# Patient Record
Sex: Male | Born: 1955 | Race: Black or African American | Hispanic: No | Marital: Married | Smoking: Current every day smoker
Health system: Southern US, Community
[De-identification: ages and names within clinical notes are randomized; demographics above are authoritative.]

## PROBLEM LIST (undated history)

## (undated) DIAGNOSIS — K469 Unspecified abdominal hernia without obstruction or gangrene: Secondary | ICD-10-CM

## (undated) DIAGNOSIS — E119 Type 2 diabetes mellitus without complications: Secondary | ICD-10-CM

## (undated) HISTORY — PX: CYSTECTOMY: SUR359

## (undated) HISTORY — PX: HEMORRHOID SURGERY: SHX153

---

## 2008-09-19 ENCOUNTER — Emergency Department (HOSPITAL_COMMUNITY): Admission: EM | Admit: 2008-09-19 | Discharge: 2008-09-19 | Payer: Self-pay | Admitting: Emergency Medicine

## 2008-09-28 ENCOUNTER — Emergency Department (HOSPITAL_COMMUNITY): Admission: EM | Admit: 2008-09-28 | Discharge: 2008-09-28 | Payer: Self-pay | Admitting: Family Medicine

## 2010-08-23 LAB — BASIC METABOLIC PANEL WITH GFR
BUN: 6 mg/dL (ref 6–23)
CO2: 27 meq/L (ref 19–32)
Calcium: 8.6 mg/dL (ref 8.4–10.5)
Chloride: 106 meq/L (ref 96–112)
Creatinine, Ser: 1.2 mg/dL (ref 0.4–1.5)
GFR calc non Af Amer: >60 mL/min
Glucose, Bld: 112 mg/dL — ABNORMAL HIGH (ref 70–99)
Potassium: 3.7 meq/L (ref 3.5–5.1)
Sodium: 138 meq/L (ref 135–145)

## 2010-08-23 LAB — DIFFERENTIAL
Basophils Absolute: 0 K/uL (ref 0.0–0.1)
Basophils Relative: 1 % (ref 0–1)
Eosinophils Absolute: 0.1 K/uL (ref 0.0–0.7)
Eosinophils Relative: 2 % (ref 0–5)
Lymphocytes Relative: 59 % — ABNORMAL HIGH (ref 12–46)
Lymphs Abs: 3.6 K/uL (ref 0.7–4.0)
Monocytes Absolute: 0.7 K/uL (ref 0.1–1.0)
Monocytes Relative: 12 % (ref 3–12)
Neutro Abs: 1.6 K/uL — ABNORMAL LOW (ref 1.7–7.7)
Neutrophils Relative %: 27 % — ABNORMAL LOW (ref 43–77)

## 2010-08-23 LAB — CBC
HCT: 40.1 % (ref 39.0–52.0)
Hemoglobin: 13.8 g/dL (ref 13.0–17.0)
MCHC: 34.3 g/dL (ref 30.0–36.0)
Platelets: 209 10*3/uL (ref 150–400)
RDW: 13.4 % (ref 11.5–15.5)

## 2010-08-23 LAB — POCT CARDIAC MARKERS
CKMB, poc: 2.7 ng/mL (ref 1.0–8.0)
Myoglobin, poc: 197 ng/mL (ref 12–200)
Troponin i, poc: <0.05 ng/mL (ref 0.00–0.09)

## 2010-08-23 LAB — D-DIMER, QUANTITATIVE: D-Dimer, Quant: 0.26 ug/mL-FEU (ref 0.00–0.48)

## 2011-05-07 ENCOUNTER — Encounter: Payer: Self-pay | Admitting: *Deleted

## 2011-05-07 ENCOUNTER — Emergency Department (HOSPITAL_COMMUNITY)
Admission: EM | Admit: 2011-05-07 | Discharge: 2011-05-07 | Disposition: A | Discharge status: Home / Self Care | Payer: 59 | Attending: Emergency Medicine | Admitting: Emergency Medicine

## 2011-05-07 ENCOUNTER — Encounter (HOSPITAL_COMMUNITY): Payer: Self-pay | Admitting: Emergency Medicine

## 2011-05-07 ENCOUNTER — Emergency Department (HOSPITAL_COMMUNITY)
Admission: EM | Admit: 2011-05-07 | Discharge: 2011-05-07 | Disposition: A | Discharge status: Home / Self Care | Payer: 59 | Source: Home / Self Care | Attending: Emergency Medicine | Admitting: Emergency Medicine

## 2011-05-07 ENCOUNTER — Emergency Department (HOSPITAL_COMMUNITY): Payer: 59

## 2011-05-07 DIAGNOSIS — K089 Disorder of teeth and supporting structures, unspecified: Secondary | ICD-10-CM | POA: Insufficient documentation

## 2011-05-07 DIAGNOSIS — J390 Retropharyngeal and parapharyngeal abscess: Secondary | ICD-10-CM

## 2011-05-07 DIAGNOSIS — M542 Cervicalgia: Secondary | ICD-10-CM | POA: Insufficient documentation

## 2011-05-07 DIAGNOSIS — R51 Headache: Secondary | ICD-10-CM | POA: Insufficient documentation

## 2011-05-07 DIAGNOSIS — R1310 Dysphagia, unspecified: Secondary | ICD-10-CM | POA: Insufficient documentation

## 2011-05-07 DIAGNOSIS — F17200 Nicotine dependence, unspecified, uncomplicated: Secondary | ICD-10-CM | POA: Insufficient documentation

## 2011-05-07 HISTORY — DX: Unspecified abdominal hernia without obstruction or gangrene: K46.9

## 2011-05-07 MED ORDER — LIDOCAINE VISCOUS 2 % MT SOLN
20.0000 mL | Freq: Once | OROMUCOSAL | Status: AC
  Administered 2011-05-07: 20 mL via OROMUCOSAL
  Filled 2011-05-07: qty 15

## 2011-05-07 MED ORDER — PANTOPRAZOLE SODIUM 40 MG PO TBEC: 40.0000 mg | DELAYED_RELEASE_TABLET | Freq: Every day | ORAL | Status: DC

## 2011-05-07 MED ORDER — IOHEXOL 300 MG/ML  SOLN
75.0000 mL | Freq: Once | INTRAMUSCULAR | Status: AC | PRN
  Administered 2011-05-07: 75 mL via INTRAVENOUS

## 2011-05-07 MED ORDER — FENTANYL CITRATE 0.05 MG/ML IJ SOLN
50.0000 ug | Freq: Once | INTRAMUSCULAR | Status: AC
  Administered 2011-05-07: 50 ug via INTRAVENOUS
  Filled 2011-05-07: qty 2

## 2011-05-07 MED ORDER — HYDROCODONE-ACETAMINOPHEN 10-300 MG/15ML PO SOLN: 10.0000 mL | Freq: Four times a day (QID) | ORAL | Status: DC | PRN

## 2011-05-07 NOTE — ED Provider Notes (Signed)
History     CSN: [id number]  Arrival date & time 05/07/11  1353   First MD Initiated Contact with Patient 05/07/11 1357      Chief Complaint  Patient presents with  . Oral Swelling    (Consider location/radiation/quality/duration/timing/severity/associated sxs/prior treatment) HPI Comments: Mr. Thain has had a five-day history of a toothache in the upper and lower dentition on the right. He saw his dentist 4 days ago at which time he was having some pain in his throat and some headache. The dentist did some x-rays and prescribed clindamycin and some pain pills, telling him that he had infections in his upper and lower molars on the right. Since then he's felt no better and today he's felt like her throat is closing up. He can't you drink anything. He has pain with swallowing and can't swallow his own saliva. He denies any difficulty with breathing. He's had no fever or chills, no swelling of the neck.   Past Medical History  Diagnosis Date  . Hernia   . Asthma     Past Surgical History  Procedure Date  . Cystectomy   . Hemorrhoid surgery     History reviewed. No pertinent family history.  History  Substance Use Topics  . Smoking status: Current Everyday Smoker -- 1.0 packs/day  . Smokeless tobacco: Not on file  . Alcohol Use: No      Review of Systems  Constitutional: Negative for fever and chills.  HENT: Positive for dental problem. Negative for sore throat, facial swelling, trouble swallowing, neck pain and voice change.   Respiratory: Negative for chest tightness and shortness of breath.     Allergies  Aspirin and Penicillins  Home Medications   Current Outpatient Rx  Name Route Sig Dispense Refill  . ACETAMINOPHEN-CODEINE PO Oral Take by mouth.      Marland Kitchen CLINDAMYCIN HCL 150 MG PO CAPS Oral Take 150 mg by mouth 3 (three) times daily.      . IBUPROFEN 800 MG PO TABS Oral Take 800 mg by mouth every 8 (eight) hours as needed.        BP 142/89  Pulse 86   Temp(Src) 98.3 F (36.8 C) (Oral)  Resp 19  SpO2 95%  Physical Exam  Nursing note and vitals reviewed. Constitutional: He appears well-developed and well-nourished. No distress.  HENT:  Head: Normocephalic and atraumatic. No trismus in the jaw.  Right Ear: External ear normal.  Left Ear: External ear normal.  Nose: Nose normal.  Mouth/Throat: Uvula is midline and oropharynx is clear and moist. Mucous membranes are not pale and not dry. No oral lesions. Abnormal dentition. Dental abscesses and dental caries present. No uvula swelling. No oropharyngeal exudate, posterior oropharyngeal edema or posterior oropharyngeal erythema.       Exam of the mouth reveals the pharynx to be clear with no obvious swelling and. There is no swelling of the floor the mouth and no pain to palpation of the floor the mouth. He has multiple carious teeth. The last 2 teeth on the right side upper and lower are tender to palpation. There is no swelling of the gingiva.  Eyes: Conjunctivae and EOM are normal. Pupils are equal, round, and reactive to light.  Neck: Normal range of motion. Neck supple.  Cardiovascular: Normal rate, regular rhythm and normal heart sounds.   Pulmonary/Chest: Effort normal and breath sounds normal. No respiratory distress.  Lymphadenopathy:    He has no cervical adenopathy.  Skin: He is not diaphoretic.  ED Course  Procedures (including critical care time)  Labs Reviewed - No data to display No results found.   1. Retropharyngeal abscess       MDM  I'm concerned that he has a retropharyngeal abscess and am sending him over to the hospital for further evaluation.        Roque Lias, MD 05/07/11 [phone]

## 2011-05-07 NOTE — ED Provider Notes (Signed)
History     CSN: [id number]  Arrival date & time 05/07/11  1645   First MD Initiated Contact with Patient 05/07/11 1657     5:44 PM HPI Patient reports 5 days ago followed up with his dentist for upper and lower right-sided tooth aches. States his dentist took an x-ray and prescribed clindamycin due to dental abscess his in both upper and lower teeth. Reports dental pain has resolved. Still has a little residual right-sided headache. However today is having difficulty swallowing. States unable to swallow at all due to severe pain. Points to pain located deep in his neck and not in the oropharynx. Denies fever, change in voice, or difficulty breathing Patient is a 55 y.o. male presenting with pharyngitis. The history is provided by the patient.  Sore Throat This is a new problem. The current episode started yesterday. The problem occurs constantly. The problem has been gradually worsening. Associated symptoms include headaches, neck pain and a sore throat. Pertinent negatives include no congestion, nausea or vomiting.    Past Medical History  Diagnosis Date  . Hernia   . Asthma     Past Surgical History  Procedure Date  . Cystectomy   . Hemorrhoid surgery     No family history on file.  History  Substance Use Topics  . Smoking status: Current Everyday Smoker -- 1.0 packs/day  . Smokeless tobacco: Not on file  . Alcohol Use: No      Review of Systems  HENT: Positive for sore throat, trouble swallowing, neck pain and dental problem. Negative for ear pain, congestion, rhinorrhea and neck stiffness.   Respiratory: Negative for shortness of breath.   Gastrointestinal: Negative for nausea and vomiting.  Neurological: Positive for headaches.  All other systems reviewed and are negative.    Allergies  Aspirin and Penicillins  Home Medications   Current Outpatient Rx  Name Route Sig Dispense Refill  . ACETAMINOPHEN-CODEINE #3 300-30 MG PO TABS Oral Take 1 tablet by mouth  every 6 (six) hours.      Marland Kitchen CLINDAMYCIN HCL 150 MG PO CAPS Oral Take 150 mg by mouth every 8 (eight) hours. For one week. Started on 12/20     . IBUPROFEN 800 MG PO TABS Oral Take 800 mg by mouth every 8 (eight) hours as needed. For pain.      BP 145/82  Pulse 88  Temp(Src) 98.2 F (36.8 C) (Oral)  Resp 22  SpO2 97%  Physical Exam  Constitutional: He is oriented to person, place, and time. He appears well-developed and well-nourished.  HENT:  Head: Normocephalic and atraumatic.  Right Ear: Tympanic membrane, external ear and ear canal normal.  Left Ear: Tympanic membrane, external ear and ear canal normal.  Nose: Nose normal.  Mouth/Throat: Uvula is midline, oropharynx is clear and moist and mucous membranes are normal.       Pain with swallowing. No difficulty with speaking.  Eyes: Pupils are equal, round, and reactive to light.  Lymphadenopathy:       Head (right side): Tonsillar adenopathy present. No submental, no submandibular and no preauricular adenopathy present.       Head (left side): No submental, no submandibular, no tonsillar, no preauricular, no posterior auricular and no occipital adenopathy present.  Neurological: He is alert and oriented to person, place, and time.  Skin: Skin is warm and dry. No rash noted. No erythema. No pallor.  Psychiatric: He has a normal mood and affect. His behavior is normal.  ED Course  Procedures   Ct Soft Tissue Neck W Contrast  05/07/2011  *RADIOLOGY REPORT*  Clinical Data: Difficulty swallowing and sore throat.  CT NECK WITH CONTRAST  Technique:  Multidetector CT imaging of the neck was performed with intravenous contrast.  Contrast: 75mL OMNIPAQUE IOHEXOL 300 MG/ML IV SOLN  Comparison: None.  Findings: The patient is missing multiple teeth.  There is periapical lucency about an upper right molar, tooth number two. There appears to be some destructive change along the periphery of the hard palate in this location.  No definite soft  tissue abscess is identified.  There is a large cavity in the lower right molar, tooth number 27.  The aerodigestive tract is unremarkable.  No mass is identified. Visualized intracranial contents appear normal.  Salivary glands have a normal CT appearance.  No lymphadenopathy is seen.  Lung apices are clear.  No focal bony abnormality with cervical spondylosis noted.  IMPRESSION:  1.  Periapical lucency about an upper right molar, tooth number two, compatible with peri apical abscess. There appears to be some bony destructive change about the tube but no soft tissue abscess is identified. 2.  Large cavity in a lower right molar, tooth number 27.  Original Report Authenticated By: Bernadene Bell. D'ALESSIO, M.D.     MDM  8:16 PM Discussed CT results with patient no acute abnormal findings. Recommend followup with your menstrual period and provide patient with referral and a PPI. Recommend return to the emergency department for persistent nausea vomiting especially if he is completely unable to swallow food. Patient states he is able to swallow if he "forces his food down". The patient agrees with plan and is ready for d/c    Thomasene Lot, [place] 05/07/11 2018

## 2011-05-07 NOTE — ED Notes (Signed)
Had right sore throat and HA - went to dentist on Thurs - had infection in 3 teeth.  Started clindamycin, Tyl #3, and IBU.  Started feeling throat swelling Fri or Sat.  States unable to eat - "won't go down easily".  Able to take drinks of water with difficulty.  Felt like yesterday his meds "got stuck".  Denies difficulty breathing, just swallowing.  Has tried gargling, coughing, drinking PO fluids without relief.  "It's like something stuck and I can't get it to go down".

## 2011-05-07 NOTE — ED Notes (Signed)
X 2 days of difficulty swallowing; saliva just sits there. Takes a while to swallow meds.

## 2011-05-07 NOTE — ED Provider Notes (Signed)
Medical screening examination/treatment/procedure(s) were performed by non-physician practitioner and as supervising physician I was immediately available for consultation/collaboration.   Sargun Rummell M Leonides Minder, MD 05/07/11 2346 

## 2011-05-07 NOTE — ED Notes (Signed)
Pt returned from CT °

## 2011-05-07 NOTE — ED Notes (Signed)
Pt transferred from [facility] to R/O retropharyngeal abscess.  Pt c/o sore throat and difficulty swallowing.  Pt had a 4 day history of toothache and was taking antibiotic per his dentist.

## 2011-05-22 ENCOUNTER — Other Ambulatory Visit (HOSPITAL_COMMUNITY): Payer: Self-pay | Admitting: Otolaryngology

## 2011-05-26 ENCOUNTER — Other Ambulatory Visit (HOSPITAL_COMMUNITY): Payer: 59

## 2011-05-26 ENCOUNTER — Ambulatory Visit (HOSPITAL_COMMUNITY)
Admission: RE | Admit: 2011-05-26 | Discharge: 2011-05-26 | Disposition: A | Discharge status: Home / Self Care | Payer: 59 | Source: Ambulatory Visit | Attending: Otolaryngology | Admitting: Otolaryngology

## 2011-05-26 DIAGNOSIS — K449 Diaphragmatic hernia without obstruction or gangrene: Secondary | ICD-10-CM | POA: Insufficient documentation

## 2011-05-26 DIAGNOSIS — R1310 Dysphagia, unspecified: Secondary | ICD-10-CM | POA: Insufficient documentation

## 2011-05-29 ENCOUNTER — Encounter: Payer: Self-pay | Admitting: Gastroenterology

## 2011-06-06 ENCOUNTER — Ambulatory Visit: Payer: 59 | Admitting: Gastroenterology

## 2011-07-21 ENCOUNTER — Emergency Department (HOSPITAL_COMMUNITY)
Admission: EM | Admit: 2011-07-21 | Discharge: 2011-07-21 | Disposition: A | Discharge status: Home / Self Care | Payer: 59 | Attending: Emergency Medicine | Admitting: Emergency Medicine

## 2011-07-21 ENCOUNTER — Emergency Department (HOSPITAL_COMMUNITY): Payer: 59

## 2011-07-21 ENCOUNTER — Encounter (HOSPITAL_COMMUNITY): Payer: Self-pay

## 2011-07-21 DIAGNOSIS — G510 Bell's palsy: Secondary | ICD-10-CM | POA: Insufficient documentation

## 2011-07-21 DIAGNOSIS — R29810 Facial weakness: Secondary | ICD-10-CM | POA: Insufficient documentation

## 2011-07-21 DIAGNOSIS — R209 Unspecified disturbances of skin sensation: Secondary | ICD-10-CM | POA: Insufficient documentation

## 2011-07-21 LAB — BASIC METABOLIC PANEL
BUN: 11 mg/dL (ref 6–23)
Creatinine, Ser: 1.21 mg/dL (ref 0.50–1.35)
GFR calc Af Amer: 76 mL/min — ABNORMAL LOW (ref >90)
GFR calc non Af Amer: 66 mL/min — ABNORMAL LOW (ref >90)
Glucose, Bld: 97 mg/dL (ref 70–99)
Potassium: 4 mEq/L (ref 3.5–5.1)

## 2011-07-21 LAB — DIFFERENTIAL
Basophils Relative: 0 % (ref 0–1)
Eosinophils Absolute: 0.1 10*3/uL (ref 0.0–0.7)
Eosinophils Relative: 1 % (ref 0–5)
Lymphs Abs: 4.1 10*3/uL — ABNORMAL HIGH (ref 0.7–4.0)
Monocytes Absolute: 0.5 10*3/uL (ref 0.1–1.0)
Monocytes Relative: 8 % (ref 3–12)

## 2011-07-21 LAB — CBC
HCT: 45.3 % (ref 39.0–52.0)
Hemoglobin: 15.3 g/dL (ref 13.0–17.0)
MCH: 27.2 pg (ref 26.0–34.0)
MCHC: 33.8 g/dL (ref 30.0–36.0)
MCV: 80.6 fL (ref 78.0–100.0)
RBC: 5.62 MIL/uL (ref 4.22–5.81)

## 2011-07-21 LAB — GLUCOSE, CAPILLARY

## 2011-07-21 MED ORDER — REFRESH P.M. OP OINT: TOPICAL_OINTMENT | Freq: Every day | OPHTHALMIC | Status: DC

## 2011-07-21 MED ORDER — VALACYCLOVIR HCL 1 G PO TABS: 1000.0000 mg | ORAL_TABLET | Freq: Three times a day (TID) | ORAL | Status: AC

## 2011-07-21 MED ORDER — PREDNISONE 20 MG PO TABS: 60.0000 mg | ORAL_TABLET | Freq: Every day | ORAL | Status: DC

## 2011-07-21 MED ORDER — TEARS RENEWED OP SOLN: 1.0000 [drp] | OPHTHALMIC | Status: DC

## 2011-07-21 NOTE — ED Notes (Signed)
Patient transported to MRI 

## 2011-07-21 NOTE — Discharge Instructions (Signed)
Bell's Palsy  Bell's palsy is a condition in which the muscles on one side of the face cannot move (paralysis). This is because the nerves in the face are paralyzed. It is most often thought to be caused by a virus. The virus causes swelling of the nerve that controls movement on one side of the face. The nerve travels through a tight space surrounded by bone. When the nerve swells, it can be compressed by the bone. This results in damage to the protective covering around the nerve. This damage interferes with how the nerve communicates with the muscles of the face. As a result, it can cause weakness or paralysis of the facial muscles.  Injury (trauma), tumor, and surgery may cause Bell's palsy, but most of the time the cause is unknown. It is a relatively common condition. It starts suddenly (abrupt onset) with the paralysis usually ending within 2 days. Bell's palsy is not dangerous. But because the eye does not close properly, you may need care to keep the eye from getting dry. This can include splinting (to keep the eye shut) or moistening with artificial tears. Bell's palsy very seldom occurs on both sides of the face at the same time. SYMPTOMS   Eyebrow sagging.   Drooping of the eyelid and corner of the mouth.   Inability to close one eye.   Loss of taste on the front of the tongue.   Sensitivity to loud noises.  TREATMENT  The treatment is usually non-surgical. If the patient is seen within the first 24 to 48 hours, a short course of steroids may be prescribed, in an attempt to shorten the length of the condition. Antiviral medicines may also be used with the steroids, but it is unclear if they are helpful.  You will need to protect your eye, if you cannot close it. The cornea (clear covering over your eye) will become dry and can be damaged. Artificial tears can be used to keep your eye moist. Glasses or an eye patch should be worn to protect your eye. PROGNOSIS  Recovery is variable,  ranging from days to months. Although the problem usually goes away completely (about 80% of cases resolve), predicting the outcome is impossible. Most people improve within 3 weeks of when the symptoms began. Improvement may continue for 3 to 6 months. A small number of people have moderate to severe weakness that is permanent.  HOME CARE INSTRUCTIONS   If your caregiver prescribed medication to reduce swelling in the nerve, use as directed. Do not stop taking the medication unless directed by your caregiver.   Use moisturizing eye drops as needed to prevent drying of your eye, as directed by your caregiver.   Protect your eye, as directed by your caregiver.   Use facial massage and exercises, as directed by your caregiver.   Perform your normal activities, and get your normal rest.  SEEK IMMEDIATE MEDICAL CARE IF:   There is pain, redness or irritation in the eye.   You or your child has an oral temperature above 102 F (38.9 C), not controlled by medicine.  MAKE SURE YOU:   Understand these instructions.   Will watch your condition.   Will get help right away if you are not doing well or get worse.  Document Released: 05/01/2005 Document Revised: 04/20/2011 Document Reviewed: 05/10/2009 [facility] Patient Information 2012 [place], [place].  RESOURCE GUIDE  Dental Problems  Patients with Medicaid: [facility]  [facility] [address]                                           [phone] [address] [employer] [address] Phone:  [phone]                                                   Phone:  [phone]  If unable to pay or uninsured, contact:  [facility] or [facility]. to become qualified for the adult dental clinic.  Chronic Pain Problems Contact [facility]  [phone] Patients need to be referred by their primary care doctor.  Insufficient Money for Medicine Contact United Way:  call "211" or [facility]  Ministry [phone].  No Primary Care Doctor Call [facility]  [phone] Other agencies that provide inexpensive medical care    [facility]  [phone]    [facility]  [phone]    [facility]  [phone]    [facility]  [phone]    [facility]  [phone]    [facility]  [phone]  Psychological Services [facility]  [phone] [facility]  [phone] [facility]   [phone] (emergency services [phone])  Abuse/Neglect [facility] Child Abuse Hotline [phone] [facility] Child Abuse Hotline 8501386761 (After Hours)  Emergency Shelter [facility] [phone]  [facility] [phone] [facility] [phone]  MRSA Hotline #:   (860)261-8132    [facility] Resources  [facility]  [employer]                           [facility] Dept. [address] [place]                     [address]         [address]  Blondell Reveal Phone:  [phone]                                  Phone:  [phone]                   Phone:  [phone]  [facility] Phone:  [phone]  [facility] Child Abuse Hotline [phone] (After Hours)

## 2011-07-21 NOTE — ED Provider Notes (Signed)
History     CSN: [id number]  Arrival date & time 07/21/11  1249   First MD Initiated Contact with Patient 07/21/11 1557      Chief Complaint  Patient presents with  . Facial Droop    RT side of face.   . Tingling    (Consider location/radiation/quality/duration/timing/severity/associated sxs/prior treatment) HPI  55yoM previously healthy pw full complaints. The patient is a poor historian. He states that approximately 2 weeks ago he began to experience intermittent tingling in the left hand all fingers and his left foot as well. There is occasional burning. He states that he has intermittent right temporal headache as well. He states over the past few days he's had difficulty opening his right eye and closing his right mouth. He noticed a facial droop yesterday and Rt eye twitches with possible swelling at that time. He states that he has been more stressed out recently. He denies alcohol, drugs. He states his diet has been poor recently. Denies recent illness including no fevers, chills. There is no neck stiffness. No history of TIA or stroke in the past. He denies weakness. He has been ambulatory with difficulty.   ED Notes, ED Provider Notes from 07/21/11 0000 to 07/21/11 13:44:19       Brandon Alphonse Guild, RN 07/21/2011 13:41      C/o RT eye burning, swelling to LT side of face, tingling to LT hand and LT foot. Also c/o throbbing to head when he lies down at night. C/o the eye swelling x few days and the rest x 1 month.     Past Medical History  Diagnosis Date  . Hernia   . Asthma     Past Surgical History  Procedure Date  . Cystectomy   . Hemorrhoid surgery     Family History  Problem Relation Age of Onset  . Cancer Other     History  Substance Use Topics  . Smoking status: Current Everyday Smoker -- 1.0 packs/day  . Smokeless tobacco: Not on file  . Alcohol Use: No      Review of Systems  All other systems reviewed and are negative.   except as noted  HPI   Allergies  Aspirin; Cephalexin; and Penicillins  Home Medications   Current Outpatient Rx  Name Route Sig Dispense Refill  . GINKOBA PO Oral Take 1 tablet by mouth daily.    Marland Kitchen REFRESH P.M. OP OINT Right Eye Place into the right eye at bedtime. 3.5 g 12  . TEARS RENEWED OP SOLN Right Eye Place 1 drop into the right eye every hour while awake. 15 mL 12  . PREDNISONE 20 MG PO TABS Oral Take 3 tablets (60 mg total) by mouth daily. 21 tablet 0  . VALACYCLOVIR HCL 1 G PO TABS Oral Take 1 tablet (1,000 mg total) by mouth 3 (three) times daily. 21 tablet 0    BP 120/77  Pulse 94  Temp(Src) 98.2 F (36.8 C) (Oral)  Resp 18  Ht 5\' 8"  (1.727 m)  Wt 265 lb (120.203 kg)  BMI 40.29 kg/m2  SpO2 96%  Physical Exam  Nursing note and vitals reviewed. Constitutional: He is oriented to person, place, and time. He appears well-developed and well-nourished. No distress.  HENT:  Head: Atraumatic.  Mouth/Throat: Oropharynx is clear and moist.  Eyes: Conjunctivae and EOM are normal. Pupils are equal, round, and reactive to light.  Neck: Neck supple.  Cardiovascular: Normal rate, regular rhythm, normal heart sounds and intact distal pulses.  Exam reveals no gallop and no friction rub.   No murmur heard. Pulmonary/Chest: Effort normal. No respiratory distress. He has no wheezes. He has no rales.  Abdominal: Soft. Bowel sounds are normal. There is no tenderness. There is no rebound and no guarding.  Musculoskeletal: Normal range of motion. He exhibits no edema and no tenderness.  Neurological: He is alert and oriented to person, place, and time. No cranial nerve deficit. He exhibits normal muscle tone. Coordination normal.       CN II-XII intact except as noted Difficulty with closing Rt eye tightly Mild Rt mouth droop Able to raise both eyebrows evenly f-->N intact  No R TA ttp  Skin: Skin is warm and dry.  Psychiatric: He has a normal mood and affect.    ED Course  Procedures  (including critical care time)  Labs Reviewed  DIFFERENTIAL - Abnormal; Notable for the following:    Neutrophils Relative 24 (*)    Neutro Abs 1.5 (*)    Lymphocytes Relative 67 (*)    Lymphs Abs 4.1 (*)    All other components within normal limits  BASIC METABOLIC PANEL - Abnormal; Notable for the following:    GFR calc non Af Amer 66 (*)    GFR calc Af Amer 76 (*)    All other components within normal limits  CBC  GLUCOSE, CAPILLARY   Mr Brain Wo Contrast  07/21/2011  *RADIOLOGY REPORT*  Clinical Data: Facial droop on the right.  Tingling.  MRI HEAD WITHOUT CONTRAST  Technique:  Multiplanar, multiecho pulse sequences of the brain and surrounding structures were obtained according to standard protocol without intravenous contrast.  Comparison: Head CT 05/07/2011  Findings: The brain has a normal appearance on all pulse sequences without evidence of old or acute infarction, mass lesion, hemorrhage, hydrocephalus or extra-axial collection.  No pituitary mass.  No inflammatory sinus disease.  No fluid in the mastoids. No skull or skull base lesion is seen.  IMPRESSION: Normal examination.  Original Report Authenticated By: Thomasenia Sales, M.D.    1. Bell's palsy     MDM  The patient presents with multiple symptoms. He is having some difficulty closing his right thigh and the right facial droop. However he is able to raise both eyebrows and was complaining of some tingling in his left upper and left lower extremity which are currently present. His MRI is negative for ischemia. This is likely an atypical Bell's palsy. The patient has been given a prescription for valacyclovir, prednisone, artificial tear drops and ointment. Given primary care provider referrals. Precautions for return.         Forbes Cellar, MD 07/21/11 Ernestina [place]

## 2011-07-21 NOTE — ED Notes (Signed)
C/o RT eye burning, swelling to LT side of face, tingling to LT hand and LT foot.  Also c/o throbbing to head when he lies down at night.  C/o the eye swelling x few days and the rest x 1 month.

## 2011-07-21 NOTE — ED Notes (Signed)
Pt remains in MRI 

## 2011-12-02 ENCOUNTER — Emergency Department (HOSPITAL_COMMUNITY)
Admission: EM | Admit: 2011-12-02 | Discharge: 2011-12-02 | Disposition: A | Discharge status: Home / Self Care | Payer: 59 | Attending: Emergency Medicine | Admitting: Emergency Medicine

## 2011-12-02 ENCOUNTER — Encounter (HOSPITAL_COMMUNITY): Payer: Self-pay | Admitting: Emergency Medicine

## 2011-12-02 ENCOUNTER — Emergency Department (HOSPITAL_COMMUNITY): Payer: 59

## 2011-12-02 DIAGNOSIS — F17200 Nicotine dependence, unspecified, uncomplicated: Secondary | ICD-10-CM | POA: Insufficient documentation

## 2011-12-02 DIAGNOSIS — J40 Bronchitis, not specified as acute or chronic: Secondary | ICD-10-CM | POA: Insufficient documentation

## 2011-12-02 DIAGNOSIS — R9389 Abnormal findings on diagnostic imaging of other specified body structures: Secondary | ICD-10-CM

## 2011-12-02 DIAGNOSIS — R9431 Abnormal electrocardiogram [ECG] [EKG]: Secondary | ICD-10-CM | POA: Insufficient documentation

## 2011-12-02 MED ORDER — ALBUTEROL SULFATE HFA 108 (90 BASE) MCG/ACT IN AERS: 2.0000 | INHALATION_SPRAY | RESPIRATORY_TRACT | Status: DC | PRN

## 2011-12-02 MED ORDER — GUAIFENESIN 100 MG/5ML PO LIQD: 100.0000 mg | ORAL | Status: AC | PRN

## 2011-12-02 MED ORDER — HYDROCOD POLST-CHLORPHEN POLST 10-8 MG/5ML PO LQCR: 5.0000 mL | Freq: Two times a day (BID) | ORAL | Status: DC | PRN

## 2011-12-02 MED ORDER — ACETAMINOPHEN 500 MG PO TABS
1000.0000 mg | ORAL_TABLET | Freq: Once | ORAL | Status: DC
  Filled 2011-12-02: qty 2

## 2011-12-02 NOTE — ED Provider Notes (Signed)
Medical screening examination/treatment/procedure(s) were performed by non-physician practitioner and as supervising physician I was immediately available for consultation/collaboration.  Juliet Rude. Rubin Payor, MD 12/02/11 [phone]

## 2011-12-02 NOTE — ED Provider Notes (Signed)
History     CSN: [id number]  Arrival date & time 12/02/11  4098   First MD Initiated Contact with Patient 12/02/11 1002      Chief Complaint  Patient presents with  . Nasal Congestion    (Consider location/radiation/quality/duration/timing/severity/associated sxs/prior treatment) HPI Comments: Patient reports he has had an itchy scratchy throat for 2 weeks, has developed nasal congestion and cough productive of yellow-green sputum over the past few days.  States he now has chest pain with coughing.  Has had chills.  Does not know if he has had fevers.  Denies SOB, ear pain.  Pt is a smoker.   The history is provided by the patient.    Past Medical History  Diagnosis Date  . Hernia   . Asthma     Past Surgical History  Procedure Date  . Cystectomy   . Hemorrhoid surgery     Family History  Problem Relation Age of Onset  . Cancer Other     History  Substance Use Topics  . Smoking status: Current Everyday Smoker -- 1.0 packs/day  . Smokeless tobacco: Never Used  . Alcohol Use: No      Review of Systems  Constitutional: Positive for chills.  HENT: Positive for congestion and sore throat. Negative for ear pain, trouble swallowing and neck pain.   Respiratory: Positive for cough and chest tightness. Negative for shortness of breath.     Allergies  Aspirin; Cephalexin; and Penicillins  Home Medications  No current outpatient prescriptions on file.  BP 132/84  Pulse 88  Temp 98.8 F (37.1 C) (Oral)  Resp 20  SpO2 98%  Physical Exam  Nursing note and vitals reviewed. Constitutional: He is oriented to person, place, and time. He appears well-developed and well-nourished. No distress.  HENT:  Head: Normocephalic and atraumatic.  Mouth/Throat: Oropharynx is clear and moist. No oropharyngeal exudate.  Eyes: No scleral icterus.  Neck: Normal range of motion. Neck supple.  Cardiovascular: Normal rate and regular rhythm.   Pulmonary/Chest: Effort normal. No  accessory muscle usage. Not tachypneic. No respiratory distress. He has no decreased breath sounds. He has wheezes. He has no rhonchi. He has no rales. He exhibits no tenderness.       Very mild expiratory wheezes over right anterior chest wall, clear with coughing.    Neurological: He is alert and oriented to person, place, and time.  Skin: He is not diaphoretic.    ED Course  Procedures (including critical care time)  Labs Reviewed - No data to display Dg Chest 2 View  12/02/2011  *RADIOLOGY REPORT*  Clinical Data: Cough and congestion.  Lethargy.  CHEST - 2 VIEW  Comparison: Chest x-ray 09/19/2008.  Findings: Mild irregularity of the lateral aspect of the left costophrenic sulcus is favored to represent an area of scarring, however, an underlying pulmonary nodule would be difficult to entirely exclude.  No acute consolidative airspace disease.  No pleural effusions.  Pulmonary vasculature and the cardiomediastinal silhouette are within normal limits.  IMPRESSION: 1.  No definite radiographic evidence of acute cardiopulmonary disease. 2.  There is mild irregularity of the lateral aspect of the left costophrenic sulcus which could represent an area of developing scarring, however, and underlying pulmonary nodule would be difficult to entirely exclude.  A follow-up radiograph in 2-3 months is recommended to ensure stability or resolution of this finding.  Original Report Authenticated By: Florencia Reasons, M.D.     1. Bronchitis   2. Chest x-ray abnormality  MDM  Patient with sore throat, productive cough, chest soreness from coughing without SOB, no pneumonia on exam.  There is an abnormal finding on CXR, which I have discussed with the patient.  Pt d/c home with dx bronchitis, albuterol hfa, tussionex, guaifenesin.  Return precautions given.  Smoking cessation materials given.  Patient verbalizes understanding and agrees with plan.          Dillard Cannon [facility]) [place], [place] 12/02/11  1241

## 2011-12-02 NOTE — ED Notes (Signed)
Pt c/o sore throat for the past week, states that throat is "not really sore anymore", onset of nasal congestion yesterday with clear drainage. States that it hurts chest when he coughs.

## 2011-12-02 NOTE — ED Notes (Signed)
Pt describes sore throat earlier in the week, onset of nasal congestion yesterday w/o drainage. Today woke w/ nasal congestion w/ clear colored drainage. Chest congestion and tightness when he tries to breath. States hurts inside "my lungs" when I try to cough.

## 2013-08-19 ENCOUNTER — Encounter: Payer: Self-pay | Admitting: Internal Medicine

## 2013-08-19 ENCOUNTER — Ambulatory Visit: Payer: Self-pay | Attending: Internal Medicine | Admitting: Internal Medicine

## 2013-08-19 VITALS — BP 121/89 | HR 93

## 2013-08-19 DIAGNOSIS — E119 Type 2 diabetes mellitus without complications: Secondary | ICD-10-CM | POA: Insufficient documentation

## 2013-08-19 DIAGNOSIS — E1149 Type 2 diabetes mellitus with other diabetic neurological complication: Secondary | ICD-10-CM | POA: Insufficient documentation

## 2013-08-19 LAB — CBC WITH DIFFERENTIAL/PLATELET
BASOS PCT: 0 % (ref 0–1)
Basophils Absolute: 0 10*3/uL (ref 0.0–0.1)
EOS ABS: 0.1 10*3/uL (ref 0.0–0.7)
EOS PCT: 1 % (ref 0–5)
HEMATOCRIT: 47.1 % (ref 39.0–52.0)
HEMOGLOBIN: 16.2 g/dL (ref 13.0–17.0)
Lymphocytes Relative: 63 % — ABNORMAL HIGH (ref 12–46)
Lymphs Abs: 5.3 10*3/uL — ABNORMAL HIGH (ref 0.7–4.0)
MCH: 27.6 pg (ref 26.0–34.0)
MCHC: 34.4 g/dL (ref 30.0–36.0)
MCV: 80.4 fL (ref 78.0–100.0)
MONO ABS: 0.7 10*3/uL (ref 0.1–1.0)
MONOS PCT: 8 % (ref 3–12)
Neutro Abs: 2.4 10*3/uL (ref 1.7–7.7)
Neutrophils Relative %: 28 % — ABNORMAL LOW (ref 43–77)
Platelets: 205 10*3/uL (ref 150–400)
RBC: 5.86 MIL/uL — ABNORMAL HIGH (ref 4.22–5.81)
RDW: 13.4 % (ref 11.5–15.5)
WBC: 8.4 10*3/uL (ref 4.0–10.5)

## 2013-08-19 LAB — POCT GLYCOSYLATED HEMOGLOBIN (HGB A1C): Hemoglobin A1C: 12.4

## 2013-08-19 LAB — GLUCOSE, POCT (MANUAL RESULT ENTRY): POC Glucose: 263 mg/dl — AB (ref 70–99)

## 2013-08-19 MED ORDER — METFORMIN HCL 500 MG PO TABS: 500.0000 mg | ORAL_TABLET | Freq: Two times a day (BID) | ORAL | Status: DC

## 2013-08-19 NOTE — Progress Notes (Signed)
Patient ID: Brandon Owens, male   DOB: April 02, 1956, 58 y.o.   MRN: [id number]   Brandon Owens, is a 58 y.o. male  WUJ:[id number]  [id number]  DOB - 12-Feb-1956  CC:  Chief Complaint  Patient presents with  . Establish Care  . Diabetes       HPI: Brandon Owens is a 58 y.o. male here today to establish medical care. Patient has no significant past medical history. Patient went to get a preemployment drug screen test in the lab and was found to be diabetic. His cbg was 312 and A1c 12.0. Pt is is denial at this time saying he doesn't believe the results. He endorses freq urination, dry mouth and metallic taste in mouth. There is positive family history of diabetes mellitus. He's not on any medication for chronic illness. He smokes heavily about one pack of cigarettes per day. He works as a Hospital doctor. Patient has No headache, No chest pain, No abdominal pain - No Nausea, No new weakness tingling or numbness, No Cough - SOB.  Allergies  Allergen Reactions  . Aspirin Anaphylaxis  . Cephalexin Anaphylaxis  . Penicillins Anaphylaxis   Past Medical History  Diagnosis Date  . Hernia   . Asthma    Current Outpatient Prescriptions on File Prior to Visit  Medication Sig Dispense Refill  . albuterol (PROVENTIL HFA;VENTOLIN HFA) 108 (90 BASE) MCG/ACT inhaler Inhale 2 puffs into the lungs every 4 (four) hours as needed for wheezing or shortness of breath (or cough).  1 Inhaler  0  . chlorpheniramine-HYDROcodone (TUSSIONEX PENNKINETIC ER) 10-8 MG/5ML LQCR Take 5 mLs by mouth every 12 (twelve) hours as needed (cough, pain).  140 mL  0  . [DISCONTINUED] pantoprazole (PROTONIX) 40 MG tablet Take 1 tablet (40 mg total) by mouth daily.  30 tablet  0   No current facility-administered medications on file prior to visit.   Family History  Problem Relation Age of Onset  . Cancer Other    History   Social History  . Marital Status: Married    Spouse Name: N/A    Number of Children: N/A  .  Years of Education: N/A   Occupational History  . Not on file.   Social History Main Topics  . Smoking status: Current Every Day Smoker -- 1.00 packs/day  . Smokeless tobacco: Never Used  . Alcohol Use: No  . Drug Use: No  . Sexual Activity:    Other Topics Concern  . Not on file   Social History Narrative  . No narrative on file    Review of Systems: Constitutional: Negative for fever, chills, diaphoresis, activity change, appetite change and fatigue. HENT: Negative for ear pain, nosebleeds, congestion, facial swelling, rhinorrhea, neck pain, neck stiffness and ear discharge.  Eyes: Negative for pain, discharge, redness, itching and visual disturbance. Respiratory: Negative for cough, choking, chest tightness, shortness of breath, wheezing and stridor.  Cardiovascular: Negative for chest pain, palpitations and leg swelling. Gastrointestinal: Negative for abdominal distention. Genitourinary: Negative for dysuria, urgency, frequency, hematuria, flank pain, decreased urine volume, difficulty urinating and dyspareunia.  Musculoskeletal: Negative for back pain, joint swelling, arthralgia and gait problem. Neurological: Negative for dizziness, tremors, seizures, syncope, facial asymmetry, speech difficulty, weakness, light-headedness, numbness and headaches.  Hematological: Negative for adenopathy. Does not bruise/bleed easily. Psychiatric/Behavioral: Negative for hallucinations, behavioral problems, confusion, dysphoric mood, decreased concentration and agitation.    Objective:   Filed Vitals:   08/19/13 1734  BP: 121/89  Pulse: 93    Physical Exam:  Constitutional: Patient appears well-developed and well-nourished. No distress. Obese HENT: Normocephalic, atraumatic, External right and left ear normal. Oropharynx is clear and moist.  Eyes: Conjunctivae and EOM are normal. PERRLA, no scleral icterus. Neck: Normal ROM. Neck supple. No JVD. No tracheal deviation. No  thyromegaly. CVS: RRR, S1/S2 +, no murmurs, no gallops, no carotid bruit.  Pulmonary: Effort and breath sounds normal, no stridor, rhonchi, wheezes, rales.  Abdominal: Soft. BS +, no distension, tenderness, rebound or guarding.  Musculoskeletal: Normal range of motion. No edema and no tenderness.  Lymphadenopathy: No lymphadenopathy noted, cervical, inguinal or axillary Neuro: Alert. Normal reflexes, muscle tone coordination. No cranial nerve deficit. Skin: Skin is warm and dry. No rash noted. Not diaphoretic. No erythema. No pallor. Psychiatric: Normal mood and affect. Behavior, judgment, thought content normal.  Lab Results  Component Value Date   WBC 6.2 07/21/2011   HGB 15.3 07/21/2011   HCT 45.3 07/21/2011   MCV 80.6 07/21/2011   PLT 201 07/21/2011   Lab Results  Component Value Date   CREATININE 1.21 07/21/2011   BUN 11 07/21/2011   NA 139 07/21/2011   K 4.0 07/21/2011   CL 103 07/21/2011   CO2 30 07/21/2011    Lab Results  Component Value Date   HGBA1C 12.4 08/19/2013   Lipid Panel  No results found for this basename: chol, trig, hdl, cholhdl, vldl, ldlcalc       Assessment and plan:   1. Diabetes  - Glucose (CBG) - HgB A1c today is 12.4%  2. Diabetes mellitus  - CBC with Differential - COMPLETE METABOLIC PANEL WITH GFR - Lipid panel - TSH - Urinalysis, Complete - Urine culture - Microalbumin, urine - Microalbumin / creatinine urine ratio - Ambulatory referral to Ophthalmology - Ambulatory referral to Podiatry  Patient declined the option of insulin therapy. He actually still does not believe he has diabetes questioned why he has to take medications.   Prescribed - metFORMIN (GLUCOPHAGE) 500 MG tablet; Take 1 tablet (500 mg total) by mouth 2 (two) times daily with a meal.  Dispense: 180 tablet; Refill: 3  Patient was counseled extensively on nutrition and exercise Patient was also counseled extensively on smoking cessation  Return in about 2 weeks (around 09/02/2013),  or if symptoms worsen or fail to improve, for CBG, Lab/Nurse Visit.  The patient was given clear instructions to go to ER or return to medical center if symptoms don't improve, worsen or new problems develop. The patient verbalized understanding. The patient was told to call to get lab results if they haven't heard anything in the next week.     This note has been created with Education officer, environmentalDragon speech recognition software and smart phrase technology. Any transcriptional errors are unintentional.    Jeanann LewandowskyJEGEDE, Kasper Mudrick, MD, MHA, FACP, FAAP [facility] [place], [place] [phone]   08/19/2013, 5:59 PM

## 2013-08-19 NOTE — Progress Notes (Signed)
Pt here to establish care for newly diagnosis Diabetes Pt states he went to get drug screen [facility] with cbg 312 and A1c 12.0 Pt is is denial at this time. States he doesn't believe results Admits to sx freq urination,dry mouth and metallic taste in mouth Family hx Diabetes noted No other medical hx noted

## 2013-08-19 NOTE — Patient Instructions (Signed)
Diabetes and Exercise Exercising regularly is important. It is not just about losing weight. It has many health benefits, such as:  Improving your overall fitness, flexibility, and endurance.  Increasing your bone density.  Helping with weight control.  Decreasing your body fat.  Increasing your muscle strength.  Reducing stress and tension.  Improving your overall health. People with diabetes who exercise gain additional benefits because exercise:  Reduces appetite.  Improves the body's use of blood sugar (glucose).  Helps lower or control blood glucose.  Decreases blood pressure.  Helps control blood lipids (such as cholesterol and triglycerides).  Improves the body's use of the hormone insulin by:  Increasing the body's insulin sensitivity.  Reducing the body's insulin needs.  Decreases the risk for heart disease because exercising:  Lowers cholesterol and triglycerides levels.  Increases the levels of good cholesterol (such as high-density lipoproteins [HDL]) in the body.  Lowers blood glucose levels. YOUR ACTIVITY PLAN  Choose an activity that you enjoy and set realistic goals. Your health care provider or diabetes educator can help you make an activity plan that works for you. You can break activities into 2 or 3 sessions throughout the day. Doing so is as good as one long session. Exercise ideas include:  Taking the dog for a walk.  Taking the stairs instead of the elevator.  Dancing to your favorite song.  Doing your favorite exercise with a friend. RECOMMENDATIONS FOR EXERCISING WITH TYPE 1 OR TYPE 2 DIABETES   Check your blood glucose before exercising. If blood glucose levels are greater than 240 mg/dL, check for urine ketones. Do not exercise if ketones are present.  Avoid injecting insulin into areas of the body that are going to be exercised. For example, avoid injecting insulin into:  The arms when playing tennis.  The legs when  jogging.  Keep a record of:  Food intake before and after you exercise.  Expected peak times of insulin action.  Blood glucose levels before and after you exercise.  The type and amount of exercise you have done.  Review your records with your health care provider. Your health care provider will help you to develop guidelines for adjusting food intake and insulin amounts before and after exercising.  If you take insulin or oral hypoglycemic agents, watch for signs and symptoms of hypoglycemia. They include:  Dizziness.  Shaking.  Sweating.  Chills.  Confusion.  Drink plenty of water while you exercise to prevent dehydration or heat stroke. Body water is lost during exercise and must be replaced.  Talk to your health care provider before starting an exercise program to make sure it is safe for you. Remember, almost any type of activity is better than none. Document Released: 07/22/2003 Document Revised: 01/01/2013 Document Reviewed: 10/08/2012 ExitCare Patient Information 2014 [employer]. Diabetes Meal Planning Guide The diabetes meal planning guide is a tool to help you plan your meals and snacks. It is important for people with diabetes to manage their blood glucose (sugar) levels. Choosing the right foods and the right amounts throughout your day will help control your blood glucose. Eating right can even help you improve your blood pressure and reach or maintain a healthy weight. CARBOHYDRATE COUNTING MADE EASY When you eat carbohydrates, they turn to sugar. This raises your blood glucose level. Counting carbohydrates can help you control this level so you feel better. When you plan your meals by counting carbohydrates, you can have more flexibility in what you eat and balance your medicine   with your food intake. Carbohydrate counting simply means adding up the total amount of carbohydrate grams in your meals and snacks. Try to eat about the same amount at each meal. Foods  with carbohydrates are listed below. Each portion below is 1 carbohydrate serving or 15 grams of carbohydrates. Ask your dietician how many grams of carbohydrates you should eat at each meal or snack. Grains and Starches  1 slice bread.   English muffin or hotdog/hamburger bun.   cup cold cereal (unsweetened).   cup cooked pasta or rice.   cup starchy vegetables (corn, potatoes, peas, beans, winter squash).  1 tortilla (6 inches).   bagel.  1 waffle or pancake (size of a CD).   cup cooked cereal.  4 to 6 small crackers. *Whole grain is recommended. Fruit  1 cup fresh unsweetened berries, melon, papaya, pineapple.  1 small fresh fruit.   banana or mango.   cup fruit juice (4 oz unsweetened).   cup canned fruit in natural juice or water.  2 tbs dried fruit.  12 to 15 grapes or cherries. Milk and Yogurt  1 cup fat-free or 1% milk.  1 cup soy milk.  6 oz light yogurt with sugar-free sweetener.  6 oz low-fat soy yogurt.  6 oz plain yogurt. Vegetables  1 cup raw or  cup cooked is counted as 0 carbohydrates or a "free" food.  If you eat 3 or more servings at 1 meal, count them as 1 carbohydrate serving. Other Carbohydrates   oz chips or pretzels.   cup ice cream or frozen yogurt.   cup sherbet or sorbet.  2 inch square cake, no frosting.  1 tbs honey, sugar, jam, jelly, or syrup.  2 small cookies.  3 squares of graham crackers.  3 cups popcorn.  6 crackers.  1 cup broth-based soup.  Count 1 cup casserole or other mixed foods as 2 carbohydrate servings.  Foods with less than 20 calories in a serving may be counted as 0 carbohydrates or a "free" food. You may want to purchase a book or computer software that lists the carbohydrate gram counts of different foods. In addition, the nutrition facts panel on the labels of the foods you eat are a good source of this information. The label will tell you how big the serving size is and the  total number of carbohydrate grams you will be eating per serving. Divide this number by 15 to obtain the number of carbohydrate servings in a portion. Remember, 1 carbohydrate serving equals 15 grams of carbohydrate. SERVING SIZES Measuring foods and serving sizes helps you make sure you are getting the right amount of food. The list below tells how big or small some common serving sizes are.  1 oz.........4 stacked dice.  3 oz.........Deck of cards.  1 tsp........Tip of little finger.  1 tbs........Thumb.  2 tbs........Golf ball.   cup.......Half of a fist.  1 cup........A fist. SAMPLE DIABETES MEAL PLAN Below is a sample meal plan that includes foods from the grain and starches, dairy, vegetable, fruit, and meat groups. A dietician can individualize a meal plan to fit your calorie needs and tell you the number of servings needed from each food group. However, controlling the total amount of carbohydrates in your meal or snack is more important than making sure you include all of the food groups at every meal. You may interchange carbohydrate containing foods (dairy, starches, and fruits). The meal plan below is an example of a 2000 calorie diet   using carbohydrate counting. This meal plan has 17 carbohydrate servings. Breakfast  1 cup oatmeal (2 carb servings).   cup light yogurt (1 carb serving).  1 cup blueberries (1 carb serving).   cup almonds. Snack  1 large apple (2 carb servings).  1 low-fat string cheese stick. Lunch  Chicken breast salad.  1 cup spinach.   cup chopped tomatoes.  2 oz chicken breast, sliced.  2 tbs low-fat Italian dressing.  12 whole-wheat crackers (2 carb servings).  12 to 15 grapes (1 carb serving).  1 cup low-fat milk (1 carb serving). Snack  1 cup carrots.   cup hummus (1 carb serving). Dinner  3 oz broiled salmon.  1 cup brown rice (3 carb servings). Snack  1  cups steamed broccoli (1 carb serving) drizzled with 1  tsp olive oil and lemon juice.  1 cup light pudding (2 carb servings). DIABETES MEAL PLANNING WORKSHEET Your dietician can use this worksheet to help you decide how many servings of foods and what types of foods are right for you.  BREAKFAST Food Group and Servings / Carb Servings Grain/Starches __________________________________ Dairy __________________________________________ Vegetable ______________________________________ Fruit ___________________________________________ Meat __________________________________________ Fat ____________________________________________ LUNCH Food Group and Servings / Carb Servings Grain/Starches ___________________________________ Dairy ___________________________________________ Fruit ____________________________________________ Meat ___________________________________________ Fat _____________________________________________ DINNER Food Group and Servings / Carb Servings Grain/Starches ___________________________________ Dairy ___________________________________________ Fruit ____________________________________________ Meat ___________________________________________ Fat _____________________________________________ SNACKS Food Group and Servings / Carb Servings Grain/Starches ___________________________________ Dairy ___________________________________________ Vegetable _______________________________________ Fruit ____________________________________________ Meat ___________________________________________ Fat _____________________________________________ DAILY TOTALS Starches _________________________ Vegetable ________________________ Fruit ____________________________ Dairy ____________________________ Meat ____________________________ Fat ______________________________ Document Released: 01/26/2005 Document Revised: 07/24/2011 Document Reviewed: 12/07/2008 ExitCare Patient Information 2014 [employer].  

## 2013-08-20 ENCOUNTER — Telehealth: Payer: Self-pay | Admitting: Emergency Medicine

## 2013-08-20 LAB — MICROALBUMIN / CREATININE URINE RATIO
CREATININE, URINE: 98.5 mg/dL
MICROALB UR: 0.73 mg/dL (ref 0.00–1.89)
Microalb Creat Ratio: 7.4 mg/g (ref 0.0–30.0)

## 2013-08-20 LAB — COMPLETE METABOLIC PANEL WITH GFR
ALT: 40 U/L (ref 0–53)
AST: 36 U/L (ref 0–37)
Albumin: 4.3 g/dL (ref 3.5–5.2)
Alkaline Phosphatase: 79 U/L (ref 39–117)
BUN: 12 mg/dL (ref 6–23)
CALCIUM: 9.6 mg/dL (ref 8.4–10.5)
CHLORIDE: 100 meq/L (ref 96–112)
CO2: 28 meq/L (ref 19–32)
CREATININE: 1.26 mg/dL (ref 0.50–1.35)
GFR, EST AFRICAN AMERICAN: 73 mL/min
GFR, Est Non African American: 63 mL/min
Glucose, Bld: 254 mg/dL — ABNORMAL HIGH (ref 70–99)
Potassium: 4.4 mEq/L (ref 3.5–5.3)
SODIUM: 137 meq/L (ref 135–145)
TOTAL PROTEIN: 7.6 g/dL (ref 6.0–8.3)
Total Bilirubin: 0.8 mg/dL (ref 0.2–1.2)

## 2013-08-20 LAB — LIPID PANEL
Cholesterol: 147 mg/dL (ref 0–200)
HDL: 45 mg/dL (ref >39)
LDL CALC: 71 mg/dL (ref 0–99)
TRIGLYCERIDES: 153 mg/dL — AB (ref <150)
Total CHOL/HDL Ratio: 3.3 Ratio
VLDL: 31 mg/dL (ref 0–40)

## 2013-08-20 LAB — URINALYSIS, COMPLETE
BACTERIA UA: NONE SEEN
BILIRUBIN URINE: NEGATIVE
Casts: NONE SEEN
Crystals: NONE SEEN
Glucose, UA: >1000 mg/dL — AB
Hgb urine dipstick: NEGATIVE
KETONES UR: NEGATIVE mg/dL
Leukocytes, UA: NEGATIVE
NITRITE: NEGATIVE
PROTEIN: NEGATIVE mg/dL
SPECIFIC GRAVITY, URINE: 1.023 (ref 1.005–1.030)
Squamous Epithelial / LPF: NONE SEEN
UROBILINOGEN UA: 0.2 mg/dL (ref 0.0–1.0)
pH: 5.5 (ref 5.0–8.0)

## 2013-08-20 LAB — URINE CULTURE
Colony Count: NO GROWTH
ORGANISM ID, BACTERIA: NO GROWTH

## 2013-08-20 LAB — TSH: TSH: 4.021 u[IU]/mL (ref 0.350–4.500)

## 2013-08-20 NOTE — Telephone Encounter (Signed)
Message copied by Darlis LoanSMITH, Frisco Cordts D on Wed Aug 20, 2013  3:42 PM ------      Message from: Jeanann LewandowskyJEGEDE, OLUGBEMIGA E      Created: Wed Aug 20, 2013  2:31 PM       Please inform patient that his laboratory tests results are mostly normal except for his blood sugar. We encourage him to adhere strictly with low carbohydrate intake, Attend nutrition the clinic, continue the metformin and follow up regularly as scheduled. ------

## 2013-08-20 NOTE — Telephone Encounter (Signed)
Pt given lab results with instruction

## 2013-08-20 NOTE — Telephone Encounter (Signed)
Message copied by Darlis LoanSMITH, JILL D on Wed Aug 20, 2013  3:02 PM ------      Message from: Jeanann LewandowskyJEGEDE, OLUGBEMIGA E      Created: Wed Aug 20, 2013  2:31 PM       Please inform patient that his laboratory tests results are mostly normal except for his blood sugar. We encourage him to adhere strictly with low carbohydrate intake, Attend nutrition the clinic, continue the metformin and follow up regularly as scheduled. ------

## 2013-08-27 ENCOUNTER — Telehealth: Payer: Self-pay | Admitting: *Deleted

## 2013-08-27 NOTE — Telephone Encounter (Signed)
Message copied by Raynelle CharyWINFREE, Lovette Merta R on Wed Aug 27, 2013  3:05 PM ------      Message from: Quentin AngstJEGEDE, OLUGBEMIGA E      Created: Tue Aug 26, 2013 11:25 PM       Please inform patient that his urine culture is negative for bacteria infection ------

## 2013-08-27 NOTE — Telephone Encounter (Signed)
Left message

## 2013-08-27 NOTE — Telephone Encounter (Signed)
Pt is aware of his urine culture

## 2013-08-28 ENCOUNTER — Telehealth: Payer: Self-pay | Admitting: *Deleted

## 2013-08-28 NOTE — Telephone Encounter (Signed)
Message copied by Raynelle CharyWINFREE, Weber Monnier R on Thu Aug 28, 2013 12:40 PM ------      Message from: Quentin AngstJEGEDE, OLUGBEMIGA E      Created: Tue Aug 26, 2013 11:25 PM       Please inform patient that his urine culture is negative for bacteria infection ------

## 2013-09-02 ENCOUNTER — Other Ambulatory Visit: Payer: Self-pay

## 2013-09-03 ENCOUNTER — Ambulatory Visit: Payer: Self-pay

## 2013-09-23 ENCOUNTER — Emergency Department (INDEPENDENT_AMBULATORY_CARE_PROVIDER_SITE_OTHER)
Admission: EM | Admit: 2013-09-23 | Discharge: 2013-09-23 | Disposition: A | Discharge status: Home / Self Care | Payer: Self-pay | Source: Home / Self Care | Attending: Family Medicine | Admitting: Family Medicine

## 2013-09-23 ENCOUNTER — Encounter (HOSPITAL_COMMUNITY): Payer: Self-pay | Admitting: Emergency Medicine

## 2013-09-23 DIAGNOSIS — L84 Corns and callosities: Secondary | ICD-10-CM

## 2013-09-23 HISTORY — DX: Type 2 diabetes mellitus without complications: E11.9

## 2013-09-23 NOTE — Discharge Instructions (Signed)
See podiatrist for further foot care.

## 2013-09-23 NOTE — ED Provider Notes (Signed)
CSN: [id number]     Arrival date & time 09/23/13  1812 History   First MD Initiated Contact with Patient 09/23/13 1944     No chief complaint on file.  (Consider location/radiation/quality/duration/timing/severity/associated sxs/prior Treatment) Patient is a 58 y.o. male presenting with lower extremity pain. The history is provided by the patient.  Foot Pain This is a chronic problem. The current episode started more than 1 week ago (many weeks of foot callouses, now painful.). The problem has been gradually worsening.    Past Medical History  Diagnosis Date  . Hernia   . Asthma    Past Surgical History  Procedure Laterality Date  . Cystectomy    . Hemorrhoid surgery     Family History  Problem Relation Age of Onset  . Cancer Other    History  Substance Use Topics  . Smoking status: Current Every Day Smoker -- 1.00 packs/day  . Smokeless tobacco: Never Used  . Alcohol Use: No    Review of Systems  Constitutional: Negative.   Skin: Positive for rash.    Allergies  Aspirin; Cephalexin; and Penicillins  Home Medications   Prior to Admission medications   Medication Sig Start Date End Date Taking? Authorizing Provider  albuterol (PROVENTIL HFA;VENTOLIN HFA) 108 (90 BASE) MCG/ACT inhaler Inhale 2 puffs into the lungs every 4 (four) hours as needed for wheezing or shortness of breath (or cough). 12/02/11 12/01/12  Rise PatienceEmily B West, PA-C  chlorpheniramine-HYDROcodone (TUSSIONEX PENNKINETIC ER) 10-8 MG/5ML LQCR Take 5 mLs by mouth every 12 (twelve) hours as needed (cough, pain). 12/02/11   Rise PatienceEmily B West, PA-C  metFORMIN (GLUCOPHAGE) 500 MG tablet Take 1 tablet (500 mg total) by mouth 2 (two) times daily with a meal. 08/19/13   Jeanann Lewandowskylugbemiga Jegede, MD   BP 112/77  Pulse 86  Temp(Src) 98.4 F (36.9 C) (Oral)  Resp 16  SpO2 99% Physical Exam  Nursing note and vitals reviewed. Constitutional: He is oriented to person, place, and time. He appears well-developed and well-nourished.   Musculoskeletal: He exhibits tenderness.  Neurological: He is alert and oriented to person, place, and time.  Skin: Skin is warm and dry.  Tender left foot callouses and nail deformity.    ED Course  Debridement Date/Time: 09/23/2013 8:04 PM Performed by: Linna HoffKINDL, Hollis Oh D Authorized by: Bradd CanaryKINDL, Madicyn Mesina D Consent: Verbal consent obtained. Risks and benefits: risks, benefits and alternatives were discussed Consent given by: patient Preparation: Patient was prepped and draped in the usual sterile fashion. Local anesthesia used: no Patient sedated: no Patient tolerance: Patient tolerated the procedure well with no immediate complications.   (including critical care time) Labs Review Labs Reviewed - No data to display  Imaging Review No results found.   MDM   1. Callus of foot        Linna HoffJames D Phil Michels, MD 09/23/13 2010

## 2013-09-23 NOTE — ED Notes (Signed)
Callous and ingrown toenail on L foot onset 1 month ago.

## 2013-10-13 ENCOUNTER — Ambulatory Visit: Payer: Self-pay | Attending: Internal Medicine | Admitting: Internal Medicine

## 2013-10-13 ENCOUNTER — Encounter: Payer: Self-pay | Admitting: Internal Medicine

## 2013-10-13 VITALS — BP 120/78 | HR 90 | Temp 98.2°F | Resp 16 | Wt 251.4 lb

## 2013-10-13 DIAGNOSIS — F17200 Nicotine dependence, unspecified, uncomplicated: Secondary | ICD-10-CM | POA: Insufficient documentation

## 2013-10-13 DIAGNOSIS — L84 Corns and callosities: Secondary | ICD-10-CM

## 2013-10-13 DIAGNOSIS — M79609 Pain in unspecified limb: Secondary | ICD-10-CM | POA: Insufficient documentation

## 2013-10-13 DIAGNOSIS — E1149 Type 2 diabetes mellitus with other diabetic neurological complication: Secondary | ICD-10-CM

## 2013-10-13 LAB — GLUCOSE, POCT (MANUAL RESULT ENTRY): POC Glucose: 104 mg/dl — AB (ref 70–99)

## 2013-10-13 MED ORDER — METFORMIN HCL 500 MG PO TABS: 500.0000 mg | ORAL_TABLET | Freq: Two times a day (BID) | ORAL | Status: DC

## 2013-10-13 MED ORDER — GABAPENTIN 300 MG PO CAPS: 300.0000 mg | ORAL_CAPSULE | Freq: Every day | ORAL | Status: DC

## 2013-10-13 NOTE — Patient Instructions (Signed)
Diabetes and Foot Care Diabetes may cause you to have problems because of poor blood supply (circulation) to your feet and legs. This may cause the skin on your feet to become thinner, break easier, and heal more slowly. Your skin may become dry, and the skin may peel and crack. You may also have nerve damage in your legs and feet causing decreased feeling in them. You may not notice minor injuries to your feet that could lead to infections or more serious problems. Taking care of your feet is one of the most important things you can do for yourself.  HOME CARE INSTRUCTIONS  Wear shoes at all times, even in the house. Do not go barefoot. Bare feet are easily injured.  Check your feet daily for blisters, cuts, and redness. If you cannot see the bottom of your feet, use a mirror or ask someone for help.  Wash your feet with warm water (do not use hot water) and mild soap. Then pat your feet and the areas between your toes until they are completely dry. Do not soak your feet as this can dry your skin.  Apply a moisturizing lotion or petroleum jelly (that does not contain alcohol and is unscented) to the skin on your feet and to dry, brittle toenails. Do not apply lotion between your toes.  Trim your toenails straight across. Do not dig under them or around the cuticle. File the edges of your nails with an emery board or nail file.  Do not cut corns or calluses or try to remove them with medicine.  Wear clean socks or stockings every day. Make sure they are not too tight. Do not wear knee-high stockings since they may decrease blood flow to your legs.  Wear shoes that fit properly and have enough cushioning. To break in new shoes, wear them for just a few hours a day. This prevents you from injuring your feet. Always look in your shoes before you put them on to be sure there are no objects inside.  Do not cross your legs. This may decrease the blood flow to your feet.  If you find a minor scrape,  cut, or break in the skin on your feet, keep it and the skin around it clean and dry. These areas may be cleansed with mild soap and water. Do not cleanse the area with peroxide, alcohol, or iodine.  When you remove an adhesive bandage, be sure not to damage the skin around it.  If you have a wound, look at it several times a day to make sure it is healing.  Do not use heating pads or hot water bottles. They may burn your skin. If you have lost feeling in your feet or legs, you may not know it is happening until it is too late.  Make sure your health care provider performs a complete foot exam at least annually or more often if you have foot problems. Report any cuts, sores, or bruises to your health care provider immediately. SEEK MEDICAL CARE IF:   You have an injury that is not healing.  You have cuts or breaks in the skin.  You have an ingrown nail.  You notice redness on your legs or feet.  You feel burning or tingling in your legs or feet.  You have pain or cramps in your legs and feet.  Your legs or feet are numb.  Your feet always feel cold. SEEK IMMEDIATE MEDICAL CARE IF:   There is increasing redness,   swelling, or pain in or around a wound.  There is a red line that goes up your leg.  Pus is coming from a wound.  You develop a fever or as directed by your health care provider.  You notice a bad smell coming from an ulcer or wound. Document Released: 04/28/2000 Document Revised: 01/01/2013 Document Reviewed: 10/08/2012 Exit[employer] Patient Information 2014 [employer].  

## 2013-10-13 NOTE — Progress Notes (Signed)
Patient here for follow up on his DM Complains of left foot bothering him from corns and callus'

## 2013-10-13 NOTE — Progress Notes (Signed)
Patient ID: Brandon Owens, male   DOB: 03/25/1956, 58 y.o.   MRN: [id number]  CC: foot pain  HPI: Patient presents today for pain in left foot. Reports that great toe on his left foot has been numb and the fifth toe has been painful for past two months.  Patient is a diabetic, last a1c reading was 12.7.  He reports that he checks his blood sugar daily and they are always under 150.  He reports that he has dramatically changed his eating habits and has dropped 20 lbs since diagnosed with DM.  He admits that he only takes one 500 mg tablet of Metformin per day because he feels he did not need to take it twice.   Allergies  Allergen Reactions  . Aspirin Anaphylaxis  . Cephalexin Anaphylaxis  . Penicillins Anaphylaxis   Past Medical History  Diagnosis Date  . Hernia   . Diabetes mellitus without complication   . Asthma     as a child   Current Outpatient Prescriptions on File Prior to Visit  Medication Sig Dispense Refill  . albuterol (PROVENTIL HFA;VENTOLIN HFA) 108 (90 BASE) MCG/ACT inhaler Inhale 2 puffs into the lungs every 4 (four) hours as needed for wheezing or shortness of breath (or cough).  1 Inhaler  0  . chlorpheniramine-HYDROcodone (TUSSIONEX PENNKINETIC ER) 10-8 MG/5ML LQCR Take 5 mLs by mouth every 12 (twelve) hours as needed (cough, pain).  140 mL  0  . metFORMIN (GLUCOPHAGE) 500 MG tablet Take 1 tablet (500 mg total) by mouth 2 (two) times daily with a meal.  180 tablet  3  . Probiotic Product (PROBIOTIC DAILY PO) Take 1 tablet by mouth.      . [DISCONTINUED] pantoprazole (PROTONIX) 40 MG tablet Take 1 tablet (40 mg total) by mouth daily.  30 tablet  0   No current facility-administered medications on file prior to visit.   Family History  Problem Relation Age of Onset  . Diabetes Other   . Heart failure Father    History   Social History  . Marital Status: Married    Spouse Name: N/A    Number of Children: N/A  . Years of Education: N/A   Occupational History   . Not on file.   Social History Main Topics  . Smoking status: Current Every Day Smoker -- 1.00 packs/day  . Smokeless tobacco: Never Used  . Alcohol Use: No  . Drug Use: No  . Sexual Activity: Not on file   Other Topics Concern  . Not on file   Social History Narrative  . No narrative on file    Review of Systems: See HPI   Objective:   Filed Vitals:   10/13/13 1518  BP: 120/78  Pulse: 90  Temp: 98.2 F (36.8 C)  Resp: 16    Physical Exam: Constitutional: Patient appears well-developed and well-nourished. No distress. Eyes: Conjunctivae and EOM are normal. PERRLA, no scleral icterus. Neck: Normal ROM. Neck supple. No JVD.  CVS: RRR, S1/S2 +, no murmurs, no gallops, no carotid bruit.  Pulmonary: Effort and breath sounds normal, no stridor, rhonchi, wheezes, rales.  Abdominal: Soft. BS +,  no distension, tenderness, rebound or guarding.  Musculoskeletal: Normal range of motion. No edema. Tenderness noted over fifth toe.  Lymphadenopathy: No lymphadenopathy noted, cervical Neuro: Alert. Normal reflexes, muscle tone coordination. No cranial nerve deficit. Skin: Skin is warm and dry.  Psychiatric: Normal mood and affect. Behavior, judgment, thought content normal. DIABETIC FOOT EXAM: Examination of the  feet reveals normal posterior tibial and dorsalis pedis pulses. Callus noted on lateral fifth toe and on plantar surface of foot . No discoloration is present. Monofilament nylon test reveals normal sensation bilaterally over plantar surfaces of distal great toe, first, third, and fifth metatarsal heads. Brisk capillary refill    Lab Results  Component Value Date   WBC 8.4 08/19/2013   HGB 16.2 08/19/2013   HCT 47.1 08/19/2013   MCV 80.4 08/19/2013   PLT 205 08/19/2013   Lab Results  Component Value Date   CREATININE 1.26 08/19/2013   BUN 12 08/19/2013   NA 137 08/19/2013   K 4.4 08/19/2013   CL 100 08/19/2013   CO2 28 08/19/2013    Lab Results  Component Value Date   HGBA1C  12.4 08/19/2013   Lipid Panel     Component Value Date/Time   CHOL 147 08/19/2013 1748   TRIG 153* 08/19/2013 1748   HDL 45 08/19/2013 1748   CHOLHDL 3.3 08/19/2013 1748   VLDL 31 08/19/2013 1748   LDLCALC 71 08/19/2013 1748       Assessment and plan:   Brandon Owens was seen today for follow-up.  Diagnoses and associated orders for this visit:  Type II or unspecified type diabetes mellitus with neurological manifestations, not stated as uncontrolled - Glucose (CBG) - Ambulatory referral to Podiatry - metFORMIN (GLUCOPHAGE) 500 MG tablet; Take 1 tablet (500 mg total) by mouth 2 (two) times daily with a meal. Educated patient on importance of taking medication as directed. - gabapentin (NEURONTIN) 300 MG capsule; Take 1 capsule (300 mg total) by mouth at bedtime. 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  Callus - Ambulatory referral to Podiatry for removal of callus.  Return in about 2 months (around 12/13/2013) for DM. Will repeat A1C at next visit.       Brandon Finland, NP-C [facility] [phone] 10/14/2013, 9:11 PM

## 2013-10-14 ENCOUNTER — Ambulatory Visit: Payer: Self-pay

## 2013-11-03 ENCOUNTER — Ambulatory Visit: Payer: Self-pay | Admitting: Podiatry

## 2013-11-12 ENCOUNTER — Encounter: Payer: Self-pay | Admitting: Podiatry

## 2013-11-12 ENCOUNTER — Ambulatory Visit (INDEPENDENT_AMBULATORY_CARE_PROVIDER_SITE_OTHER): Payer: BC Managed Care – PPO | Admitting: Podiatry

## 2013-11-12 VITALS — BP 122/75 | HR 77 | Resp 15 | Ht 68.0 in | Wt 252.0 lb

## 2013-11-12 DIAGNOSIS — M2040 Other hammer toe(s) (acquired), unspecified foot: Secondary | ICD-10-CM

## 2013-11-12 DIAGNOSIS — L84 Corns and callosities: Secondary | ICD-10-CM

## 2013-11-12 NOTE — Progress Notes (Signed)
   Subjective:    Patient ID: Brandon BisonDavid Owens, male    DOB: 1956-02-28, 58 y.o.   MRN: [id number]  HPI Comments: N debridement L left medial 1st MPJ and plantar 5th MPJ and 5th toe D years - callous, corn - 5th toe in last 3 - 4 months O worsening C painful hard callous and corn A shoe wear, weightbearing, driving T [facility]ck [facility] Dr. 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   Review of Systems  All other systems reviewed and are negative.      Objective:   Physical Exam  Orientated x3 black male  Vascular: DP and PT pulses 2/4 bilaterally Capillary reflex immediate bilaterally  Neurological: Sensation to 10 g monofilament wire intact 5/5 bilaterally Ankle reflexes equal and reactive bilaterally Vibratory sensation intact bilaterally  Dermatological: Plantar keratoses medial plantar left first MPJ and fourth left MPJ  Keratoses fifth left toe  Musculoskeletal: Left HAV Adductovarus fifth left toe with associated hyperkeratoses       Assessment & Plan:   Assessment: Satisfactory neurovascular status Hammertoe deformity fifth left Keratoses fifth left toe Plantar keratoses left associated with left HAV  Plan: Discussed patient's HAV and associated keratoses and hammertoe fifth left with associated keratoses  The keratoses were debrided. Patient advised to wear a soft leather upper shoe with a wide toebox Is advised that these lesions would recur and to return as needed for further evaluation and treatment  Reappoint at patient's request

## 2013-11-12 NOTE — Patient Instructions (Signed)
Diabetes and Foot Care Diabetes may cause you to have problems because of poor blood supply (circulation) to your feet and legs. This may cause the skin on your feet to become thinner, break easier, and heal more slowly. Your skin may become dry, and the skin may peel and crack. You may also have nerve damage in your legs and feet causing decreased feeling in them. You may not notice minor injuries to your feet that could lead to infections or more serious problems. Taking care of your feet is one of the most important things you can do for yourself.  HOME CARE INSTRUCTIONS  Wear shoes at all times, even in the house. Do not go barefoot. Bare feet are easily injured.  Check your feet daily for blisters, cuts, and redness. If you cannot see the bottom of your feet, use a mirror or ask someone for help.  Wash your feet with warm water (do not use hot water) and mild soap. Then pat your feet and the areas between your toes until they are completely dry. Do not soak your feet as this can dry your skin.  Apply a moisturizing lotion or petroleum jelly (that does not contain alcohol and is unscented) to the skin on your feet and to dry, brittle toenails. Do not apply lotion between your toes.  Trim your toenails straight across. Do not dig under them or around the cuticle. File the edges of your nails with an emery board or nail file.  Do not cut corns or calluses or try to remove them with medicine.  Wear clean socks or stockings every day. Make sure they are not too tight. Do not wear knee-high stockings since they may decrease blood flow to your legs.  Wear shoes that fit properly and have enough cushioning. To break in new shoes, wear them for just a few hours a day. This prevents you from injuring your feet. Always look in your shoes before you put them on to be sure there are no objects inside.  Do not cross your legs. This may decrease the blood flow to your feet.  If you find a minor scrape,  cut, or break in the skin on your feet, keep it and the skin around it clean and dry. These areas may be cleansed with mild soap and water. Do not cleanse the area with peroxide, alcohol, or iodine.  When you remove an adhesive bandage, be sure not to damage the skin around it.  If you have a wound, look at it several times a day to make sure it is healing.  Do not use heating pads or hot water bottles. They may burn your skin. If you have lost feeling in your feet or legs, you may not know it is happening until it is too late.  Make sure your health care provider performs a complete foot exam at least annually or more often if you have foot problems. Report any cuts, sores, or bruises to your health care provider immediately. SEEK MEDICAL CARE IF:   You have an injury that is not healing.  You have cuts or breaks in the skin.  You have an ingrown nail.  You notice redness on your legs or feet.  You feel burning or tingling in your legs or feet.  You have pain or cramps in your legs and feet.  Your legs or feet are numb.  Your feet always feel cold. SEEK IMMEDIATE MEDICAL CARE IF:   There is increasing redness,   swelling, or pain in or around a wound.  There is a red line that goes up your leg.  Pus is coming from a wound.  You develop a fever or as directed by your health care provider.  You notice a bad smell coming from an ulcer or wound. Document Released: 04/28/2000 Document Revised: 01/01/2013 Document Reviewed: 10/08/2012 Exit[employer] Patient Information 2015 [employer]. This information is not intended to replace advice given to you by your health care provider. Make sure you discuss any questions you have with your health care provider.  

## 2013-11-13 ENCOUNTER — Encounter: Payer: Self-pay | Admitting: Podiatry

## 2013-12-12 ENCOUNTER — Ambulatory Visit: Payer: Self-pay | Admitting: Internal Medicine

## 2014-01-21 ENCOUNTER — Other Ambulatory Visit: Payer: Self-pay | Admitting: Internal Medicine

## 2014-01-22 NOTE — Telephone Encounter (Signed)
Pt. Calling to get refills on several medications, pt. Sates that the only day he can come to see the doctor is on Wed. And his PCP does not have an available appt, pt. States that he will not have medication to last him until his next appt. Pt. Would like enough medication until his appt. 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 Asks to please leave a message. Please f/u with pt.

## 2014-01-26 ENCOUNTER — Other Ambulatory Visit: Payer: Self-pay | Admitting: Internal Medicine

## 2014-01-26 DIAGNOSIS — E1149 Type 2 diabetes mellitus with other diabetic neurological complication: Secondary | ICD-10-CM

## 2014-01-26 MED ORDER — GABAPENTIN 300 MG PO CAPS: 300.0000 mg | ORAL_CAPSULE | Freq: Every day | ORAL | Status: DC

## 2014-02-04 ENCOUNTER — Ambulatory Visit: Payer: Self-pay | Admitting: Internal Medicine

## 2014-03-02 ENCOUNTER — Telehealth: Payer: Self-pay | Admitting: Emergency Medicine

## 2014-03-02 ENCOUNTER — Telehealth: Payer: Self-pay | Admitting: Internal Medicine

## 2014-03-02 DIAGNOSIS — E119 Type 2 diabetes mellitus without complications: Secondary | ICD-10-CM

## 2014-03-02 MED ORDER — GABAPENTIN 300 MG PO CAPS: 300.0000 mg | ORAL_CAPSULE | Freq: Every day | ORAL | Status: DC

## 2014-03-02 MED ORDER — METFORMIN HCL 500 MG PO TABS: 500.0000 mg | ORAL_TABLET | Freq: Two times a day (BID) | ORAL | Status: DC

## 2014-03-02 NOTE — Telephone Encounter (Signed)
Pt. Came into facility to request medication refills for all current medications, pt. Would like enough medication to last him until his next appt. Please f/u with pt.

## 2014-03-02 NOTE — Telephone Encounter (Signed)
Attempted to reach pt in regards to medication refills. Medication reordered with 30 day supply only until seen by provider

## 2014-06-22 ENCOUNTER — Other Ambulatory Visit: Payer: Self-pay | Admitting: Internal Medicine

## 2014-06-26 ENCOUNTER — Other Ambulatory Visit: Payer: Self-pay | Admitting: Emergency Medicine

## 2014-06-26 ENCOUNTER — Telehealth: Payer: Self-pay | Admitting: Internal Medicine

## 2014-06-26 DIAGNOSIS — E119 Type 2 diabetes mellitus without complications: Secondary | ICD-10-CM

## 2014-06-26 MED ORDER — GABAPENTIN 300 MG PO CAPS: 300.0000 mg | ORAL_CAPSULE | Freq: Every day | ORAL | Status: DC

## 2014-06-26 NOTE — Telephone Encounter (Signed)
Patient is requesting a refill on gabapentin (NEURONTIN) 300 MG capsule. Please follow up with pt.

## 2014-07-24 ENCOUNTER — Other Ambulatory Visit: Payer: Self-pay | Admitting: Internal Medicine

## 2014-07-24 ENCOUNTER — Telehealth: Payer: Self-pay | Admitting: Internal Medicine

## 2014-07-24 NOTE — Telephone Encounter (Signed)
Pt called requesting medication refill on metFORMIN (GLUCOPHAGE) 500 MG tablet, pt is requesting medication to be sent to [facility] on [address] .Please f/u with pt

## 2014-07-27 ENCOUNTER — Telehealth: Payer: Self-pay | Admitting: Emergency Medicine

## 2014-07-28 ENCOUNTER — Telehealth: Payer: Self-pay | Admitting: Internal Medicine

## 2014-07-28 NOTE — Telephone Encounter (Signed)
Pt called is requesting medication refill for metFORMIN (GLUCOPHAGE) 500 MG tablet. Pt states he is completely out please f/u with pt

## 2014-07-29 ENCOUNTER — Ambulatory Visit: Payer: Self-pay | Attending: Family Medicine | Admitting: Family Medicine

## 2014-07-29 ENCOUNTER — Encounter: Payer: Self-pay | Admitting: Family Medicine

## 2014-07-29 VITALS — BP 113/84 | HR 84 | Temp 98.5°F | Resp 16 | Ht 68.0 in | Wt 244.0 lb

## 2014-07-29 DIAGNOSIS — Z720 Tobacco use: Secondary | ICD-10-CM

## 2014-07-29 DIAGNOSIS — F17200 Nicotine dependence, unspecified, uncomplicated: Secondary | ICD-10-CM | POA: Insufficient documentation

## 2014-07-29 DIAGNOSIS — E1149 Type 2 diabetes mellitus with other diabetic neurological complication: Secondary | ICD-10-CM

## 2014-07-29 DIAGNOSIS — R946 Abnormal results of thyroid function studies: Secondary | ICD-10-CM

## 2014-07-29 DIAGNOSIS — N400 Enlarged prostate without lower urinary tract symptoms: Secondary | ICD-10-CM

## 2014-07-29 DIAGNOSIS — E119 Type 2 diabetes mellitus without complications: Secondary | ICD-10-CM | POA: Insufficient documentation

## 2014-07-29 DIAGNOSIS — L84 Corns and callosities: Secondary | ICD-10-CM | POA: Insufficient documentation

## 2014-07-29 DIAGNOSIS — Z114 Encounter for screening for human immunodeficiency virus [HIV]: Secondary | ICD-10-CM | POA: Insufficient documentation

## 2014-07-29 LAB — POCT GLYCOSYLATED HEMOGLOBIN (HGB A1C): Hemoglobin A1C: 5.9

## 2014-07-29 LAB — GLUCOSE, POCT (MANUAL RESULT ENTRY): POC GLUCOSE: 77 mg/dL (ref 70–99)

## 2014-07-29 MED ORDER — GABAPENTIN 300 MG PO CAPS: 300.0000 mg | ORAL_CAPSULE | Freq: Every day | ORAL | Status: DC

## 2014-07-29 MED ORDER — METFORMIN HCL 500 MG PO TABS: 500.0000 mg | ORAL_TABLET | Freq: Two times a day (BID) | ORAL | Status: DC

## 2014-07-29 MED ORDER — LISINOPRIL 5 MG PO TABS: 5.0000 mg | ORAL_TABLET | Freq: Every day | ORAL | Status: DC

## 2014-07-29 NOTE — Assessment & Plan Note (Signed)
-   Previously abnormal TSH, will repeat.

## 2014-07-29 NOTE — Assessment & Plan Note (Signed)
-  No evidence of ulceration or infection. -Discussed foot care with the patient an I have referred him to a Podiatrist and advised him to use his Diabetic shoes all the time which he has not been doing.

## 2014-07-29 NOTE — Assessment & Plan Note (Addendum)
With Neurological manifestations. - Controlled with significant drop in Hba1c from 12.4 to 5.9 in 11 months -Continue Metformin -Will add ACEI for renoprotection (side effects discussed) -Gabapentin for Neuropathy -Advised to schedule and eye exam with an Optometrist or Opthalmologist -Refuses Pneumovax -Lipid panel ordered.

## 2014-07-29 NOTE — Progress Notes (Signed)
Subjective:     Patient ID: Brandon Owens, male   DOB: [birth year], 59 y.o.   MRN: [id number]  HPI  This patient was last seen in 10/2013 and had been unable to come in due to his work schedule. He has a history of Type 2 DM.  Disease Monitoring  Does not check blood sugars at home  Polyuria: yes   Visual problems: no.   Medication Compliance: yes  Medication:none reported  Hypoglycemia: No  Preventitive Health Care  Eye Exam: no  Foot Exam: no; last exam was in 11/2013 but he states he does not like the Doctor he was referred to. Admits to some paresthesia worse in both big toes which have improved with his use of Gabapentin.  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 He does dmit to urinary symptoms especially hesitancy and nocturia but is refusing to go back on Flomax and states "i will deal with it".  Lab Results  Component Value Date   HGBA1C 5.90 07/29/2014   Lab Results  Component Value Date   LDLCALC 71 08/19/2013   Lab Results  Component Value Date   TSH 4.021 08/19/2013   Lab Results  Component Value Date   HGBA1C 5.90 07/29/2014  .  Review of Systems  Constitutional: Negative for fever, appetite change and fatigue.  HENT: Negative for sinus pressure and sore throat.   Respiratory: Negative for apnea, cough and shortness of breath.   Cardiovascular: Negative for chest pain, palpitations and leg swelling.  Gastrointestinal: Negative for nausea, abdominal pain, diarrhea and constipation.  Genitourinary:       See HPI  Skin:       See HPI  Allergic/Immunologic: Negative for environmental allergies.  Neurological:       See HPI  Psychiatric/Behavioral: Negative for behavioral problems.    Review of Systems - Negative except as stated in the HPI      Objective:   Physical Exam  BP 113/84 mmHg   Pulse 84   Temp(Src) 98.5 F (36.9 C) (Oral)   Resp 16   Ht 5\' 8"  (1.727 m)   Wt 244 lb (110.678 kg)   BMI 37.11 kg/m2   SpO2 95%  General Appearance:    Alert, cooperative, no distress, appears stated age, overweight  Head:    Normocephalic, without obvious abnormality, atraumatic  Eyes:    PERRL, conjunctiva/corneas clear, EOM's intact, fundi    benign, both eyes       Ears:    Normal TM's and external ear canals, both ears  Nose:   Nares normal, septum midline, mucosa normal, no drainage   or sinus tenderness  Throat:   Lips, mucosa, and tongue normal; teeth and gums normal  Neck:   Supple, symmetrical, trachea midline, no adenopathy;       thyroid:  No enlargement/tenderness/nodules; no carotid   bruit or JVD  Back:     Symmetric, no curvature, ROM normal, no CVA tenderness  Lungs:     Clear to auscultation bilaterally, respirations unlabored  Chest wall:    No tenderness or deformity  Heart:    Regular rate and rhythm, S1 and S2 normal, no murmur, rub  or gallop  Abdomen:     Soft, non-tender, bowel sounds active all four quadrants,    no masses, no organomegaly  Genitalia:    Normal male without lesion, discharge or tenderness  Rectal:    Normal tone, normal prostate, no masses or tenderness;   guaiac negative stool  Extremities:   Extremities normal, atraumatic, no cyanosis or edema  Pulses:   2+ and symmetric all extremities  Skin:   Skin color normal, skin of feet abnormal as reflected on foot exam  Lymph nodes:   Cervical, supraclavicular, and axillary nodes normal  Neurologic:   CNII-XII intact. Normal strength, sensation and reflexes      Throughout Feet- bilateral bunion, normal dorsalis pedis and Tibialis posterior pulses bilaterally; multiple calluses on sole of feet which are not tender. Pre ulcerative callus on medial aspect of posterior left heel, normal  monofilament testing. .        Assessment:       Plan:     .

## 2014-07-29 NOTE — Assessment & Plan Note (Signed)
-  Refuses initiation of medication due to a previous side effect and he will inform us when he changes his mind.

## 2014-07-29 NOTE — Patient Instructions (Addendum)
Keep blood sugar logs with fasting goals of 80-120 mg/dl, random of less than [id number] and in the event of sugars less than 60 mg/dl or greater than 096400 mg/dl please notify the clinic ASAP. Be sure to keep you appointment for an annual eye exam and an annual foot exam. Pneumovax is recommended every 5 years before the age of 59 and once for a lifetime at or after the age of 59.  Smoking cessation support: smoking cessation hotline: 1-800-QUIT-NOW.  Smoking cessation classes are available through [facility]. Call [phone] or visit our website at [email or web address].  Diabetes Mellitus and Food It is important for you to manage your blood sugar (glucose) level. Your blood glucose level can be greatly affected by what you eat. Eating healthier foods in the appropriate amounts throughout the day at about the same time each day will help you control your blood glucose level. It can also help slow or prevent worsening of your diabetes mellitus. Healthy eating may even help you improve the level of your blood pressure and reach or maintain a healthy weight.  HOW CAN FOOD AFFECT ME? Carbohydrates Carbohydrates affect your blood glucose level more than any other type of food. Your dietitian will help you determine how many carbohydrates to eat at each meal and teach you how to count carbohydrates. Counting carbohydrates is important to keep your blood glucose at a healthy level, especially if you are using insulin or taking certain medicines for diabetes mellitus. Alcohol Alcohol can cause sudden decreases in blood glucose (hypoglycemia), especially if you use insulin or take certain medicines for diabetes mellitus. Hypoglycemia can be a life-threatening condition. Symptoms of hypoglycemia (sleepiness, dizziness, and disorientation) are similar to symptoms of having too much alcohol.  If your health care provider has given you approval to drink alcohol, do so in moderation and use the  following guidelines:  Women should not have more than one drink per day, and men should not have more than two drinks per day. One drink is equal to:  12 oz of beer.  5 oz of wine.  1 oz of hard liquor.  Do not drink on an empty stomach.  Keep yourself hydrated. Have water, diet soda, or unsweetened iced tea.  Regular soda, juice, and other mixers might contain a lot of carbohydrates and should be counted. WHAT FOODS ARE NOT RECOMMENDED? As you make food choices, it is important to remember that all foods are not the same. Some foods have fewer nutrients per serving than other foods, even though they might have the same number of calories or carbohydrates. It is difficult to get your body what it needs when you eat foods with fewer nutrients. Examples of foods that you should avoid that are high in calories and carbohydrates but low in nutrients include:  Trans fats (most processed foods list trans fats on the Nutrition Facts label).  Regular soda.  Juice.  Candy.  Sweets, such as cake, pie, doughnuts, and cookies.  Fried foods. WHAT FOODS CAN I EAT? Have nutrient-rich foods, which will nourish your body and keep you healthy. The food you should eat also will depend on several factors, including:  The calories you need.  The medicines you take.  Your weight.  Your blood glucose level.  Your blood pressure level.  Your cholesterol level. You also should eat a variety of foods, including:  Protein, such as meat, poultry, fish, tofu, nuts, and seeds (lean animal proteins are best).  Fruits.  Vegetables.  Dairy products, such as milk, cheese, and yogurt (low fat is best).  Breads, grains, pasta, cereal, rice, and beans.  Fats such as olive oil, trans fat-free margarine, canola oil, avocado, and olives. DOES EVERYONE WITH DIABETES MELLITUS HAVE THE SAME MEAL PLAN? Because every person with diabetes mellitus is different, there is not one meal plan that works for  everyone. It is very important that you meet with a dietitian who will help you create a meal plan that is just right for you. Document Released: 01/26/2005 Document Revised: 05/06/2013 Document Reviewed: 03/28/2013 [facility] Patient Information 2015 [place], [place]. This information is not intended to replace advice given to you by your health care provider. Make sure you discuss any questions you have with your health care provider.

## 2014-07-29 NOTE — Progress Notes (Signed)
Pt is here to establish care. Pt has a history of diabetes. Pt has some ulcers on both his feet. Pt states that he has numbness in his feet. Pt also said that he has trouble urinating.

## 2014-08-05 ENCOUNTER — Ambulatory Visit: Payer: Self-pay | Attending: Family Medicine

## 2014-08-05 DIAGNOSIS — R946 Abnormal results of thyroid function studies: Secondary | ICD-10-CM

## 2014-08-05 DIAGNOSIS — Z114 Encounter for screening for human immunodeficiency virus [HIV]: Secondary | ICD-10-CM

## 2014-08-05 DIAGNOSIS — E119 Type 2 diabetes mellitus without complications: Secondary | ICD-10-CM

## 2014-08-05 DIAGNOSIS — E1149 Type 2 diabetes mellitus with other diabetic neurological complication: Secondary | ICD-10-CM

## 2014-08-05 LAB — COMPREHENSIVE METABOLIC PANEL
ALBUMIN: 4 g/dL (ref 3.5–5.2)
ALT: 38 U/L (ref 0–53)
AST: 35 U/L (ref 0–37)
Alkaline Phosphatase: 56 U/L (ref 39–117)
BUN: 14 mg/dL (ref 6–23)
CALCIUM: 9.3 mg/dL (ref 8.4–10.5)
CHLORIDE: 104 meq/L (ref 96–112)
CO2: 27 meq/L (ref 19–32)
Creat: 1.14 mg/dL (ref 0.50–1.35)
GLUCOSE: 106 mg/dL — AB (ref 70–99)
POTASSIUM: 4.6 meq/L (ref 3.5–5.3)
SODIUM: 139 meq/L (ref 135–145)
TOTAL PROTEIN: 6.8 g/dL (ref 6.0–8.3)
Total Bilirubin: 0.7 mg/dL (ref 0.2–1.2)

## 2014-08-05 LAB — LDL CHOLESTEROL, DIRECT: Direct LDL: 67 mg/dL

## 2014-08-05 LAB — T4, FREE: FREE T4: 0.87 ng/dL (ref 0.80–1.80)

## 2014-08-05 LAB — TSH: TSH: 2.685 u[IU]/mL (ref 0.350–4.500)

## 2014-08-06 LAB — MICROALBUMIN / CREATININE URINE RATIO
CREATININE, URINE: 102.4 mg/dL
MICROALB UR: 1 mg/dL (ref <2.0)
MICROALB/CREAT RATIO: 9.8 mg/g (ref 0.0–30.0)

## 2014-08-06 LAB — HIV ANTIBODY (ROUTINE TESTING W REFLEX): HIV: NONREACTIVE

## 2014-08-06 NOTE — Progress Notes (Signed)
Quick Note:  Please inform the patient that labs are normal. Thank you. ______ 

## 2014-08-07 ENCOUNTER — Telehealth: Payer: Self-pay | Admitting: *Deleted

## 2014-08-07 ENCOUNTER — Telehealth: Payer: Self-pay | Admitting: Internal Medicine

## 2014-08-07 NOTE — Telephone Encounter (Signed)
-----   Message from Jaclyn ShaggyEnobong Amao, MD sent at 08/06/2014  8:41 AM EDT ----- Please inform the patient that labs are normal. Thank you.

## 2014-08-07 NOTE — Telephone Encounter (Signed)
Pt is aware of his lab results. 

## 2014-08-07 NOTE — Telephone Encounter (Signed)
Patient is returning call to request results. Please f/u with pt.

## 2014-08-07 NOTE — Telephone Encounter (Signed)
Left vm to return my call

## 2014-08-26 ENCOUNTER — Encounter: Payer: BLUE CROSS/BLUE SHIELD | Admitting: Podiatrist

## 2014-08-27 ENCOUNTER — Ambulatory Visit: Payer: Self-pay

## 2014-09-11 NOTE — Progress Notes (Signed)
This encounter was created in error - please disregard.

## 2015-01-07 ENCOUNTER — Inpatient Hospital Stay (HOSPITAL_COMMUNITY)
Admission: EM | Admit: 2015-01-07 | Discharge: 2015-01-07 | DRG: 872 | Disposition: A | Discharge status: Home / Self Care | Payer: BLUE CROSS/BLUE SHIELD | Attending: Internal Medicine | Admitting: Internal Medicine

## 2015-01-07 ENCOUNTER — Encounter (HOSPITAL_COMMUNITY): Payer: Self-pay | Admitting: Emergency Medicine

## 2015-01-07 ENCOUNTER — Encounter (HOSPITAL_COMMUNITY): Payer: Self-pay | Admitting: General Surgery

## 2015-01-07 ENCOUNTER — Inpatient Hospital Stay (HOSPITAL_COMMUNITY)
Admission: EM | Admit: 2015-01-07 | Discharge: 2015-01-08 | Disposition: A | Discharge status: Home / Self Care | Payer: BLUE CROSS/BLUE SHIELD | Source: Home / Self Care | Attending: Internal Medicine | Admitting: Internal Medicine

## 2015-01-07 ENCOUNTER — Emergency Department (HOSPITAL_COMMUNITY): Payer: BLUE CROSS/BLUE SHIELD

## 2015-01-07 DIAGNOSIS — L02214 Cutaneous abscess of groin: Secondary | ICD-10-CM | POA: Diagnosis not present

## 2015-01-07 DIAGNOSIS — Z720 Tobacco use: Secondary | ICD-10-CM | POA: Diagnosis not present

## 2015-01-07 DIAGNOSIS — L0291 Cutaneous abscess, unspecified: Secondary | ICD-10-CM | POA: Diagnosis present

## 2015-01-07 DIAGNOSIS — N492 Inflammatory disorders of scrotum: Secondary | ICD-10-CM

## 2015-01-07 DIAGNOSIS — Z888 Allergy status to other drugs, medicaments and biological substances status: Secondary | ICD-10-CM | POA: Diagnosis not present

## 2015-01-07 DIAGNOSIS — L02215 Cutaneous abscess of perineum: Secondary | ICD-10-CM | POA: Diagnosis present

## 2015-01-07 DIAGNOSIS — A419 Sepsis, unspecified organism: Principal | ICD-10-CM | POA: Diagnosis present

## 2015-01-07 DIAGNOSIS — I10 Essential (primary) hypertension: Secondary | ICD-10-CM | POA: Diagnosis present

## 2015-01-07 DIAGNOSIS — F17200 Nicotine dependence, unspecified, uncomplicated: Secondary | ICD-10-CM | POA: Diagnosis present

## 2015-01-07 DIAGNOSIS — E1149 Type 2 diabetes mellitus with other diabetic neurological complication: Secondary | ICD-10-CM | POA: Diagnosis present

## 2015-01-07 DIAGNOSIS — Z880 Allergy status to penicillin: Secondary | ICD-10-CM

## 2015-01-07 DIAGNOSIS — Z6836 Body mass index (BMI) 36.0-36.9, adult: Secondary | ICD-10-CM

## 2015-01-07 DIAGNOSIS — J449 Chronic obstructive pulmonary disease, unspecified: Secondary | ICD-10-CM | POA: Diagnosis present

## 2015-01-07 DIAGNOSIS — Z886 Allergy status to analgesic agent status: Secondary | ICD-10-CM | POA: Diagnosis not present

## 2015-01-07 DIAGNOSIS — E1140 Type 2 diabetes mellitus with diabetic neuropathy, unspecified: Secondary | ICD-10-CM | POA: Diagnosis not present

## 2015-01-07 DIAGNOSIS — F17210 Nicotine dependence, cigarettes, uncomplicated: Secondary | ICD-10-CM | POA: Diagnosis present

## 2015-01-07 DIAGNOSIS — E669 Obesity, unspecified: Secondary | ICD-10-CM | POA: Diagnosis present

## 2015-01-07 LAB — BASIC METABOLIC PANEL
Anion gap: 7 (ref 5–15)
BUN: 13 mg/dL (ref 6–20)
CHLORIDE: 102 mmol/L (ref 101–111)
CO2: 28 mmol/L (ref 22–32)
CREATININE: 1.34 mg/dL — AB (ref 0.61–1.24)
Calcium: 8.7 mg/dL — ABNORMAL LOW (ref 8.9–10.3)
GFR calc Af Amer: >60 mL/min (ref >60)
GFR calc non Af Amer: 56 mL/min — ABNORMAL LOW (ref >60)
Glucose, Bld: 92 mg/dL (ref 65–99)
Potassium: 4 mmol/L (ref 3.5–5.1)
SODIUM: 137 mmol/L (ref 135–145)

## 2015-01-07 LAB — CBG MONITORING, ED: GLUCOSE-CAPILLARY: 95 mg/dL (ref 65–99)

## 2015-01-07 LAB — CBC WITH DIFFERENTIAL/PLATELET
Basophils Absolute: 0 10*3/uL (ref 0.0–0.1)
Basophils Relative: 0 % (ref 0–1)
EOS ABS: 0.2 10*3/uL (ref 0.0–0.7)
EOS PCT: 2 % (ref 0–5)
HCT: 42.2 % (ref 39.0–52.0)
Hemoglobin: 14.3 g/dL (ref 13.0–17.0)
LYMPHS ABS: 4 10*3/uL (ref 0.7–4.0)
Lymphocytes Relative: 34 % (ref 12–46)
MCH: 27.8 pg (ref 26.0–34.0)
MCHC: 33.9 g/dL (ref 30.0–36.0)
MCV: 81.9 fL (ref 78.0–100.0)
MONOS PCT: 11 % (ref 3–12)
Monocytes Absolute: 1.3 10*3/uL — ABNORMAL HIGH (ref 0.1–1.0)
Neutro Abs: 6.2 10*3/uL (ref 1.7–7.7)
Neutrophils Relative %: 53 % (ref 43–77)
PLATELETS: 174 10*3/uL (ref 150–400)
RBC: 5.15 MIL/uL (ref 4.22–5.81)
RDW: 12.9 % (ref 11.5–15.5)
WBC: 11.6 10*3/uL — AB (ref 4.0–10.5)

## 2015-01-07 LAB — URINALYSIS, ROUTINE W REFLEX MICROSCOPIC
BILIRUBIN URINE: NEGATIVE
GLUCOSE, UA: NEGATIVE mg/dL
HGB URINE DIPSTICK: NEGATIVE
KETONES UR: NEGATIVE mg/dL
Leukocytes, UA: NEGATIVE
Nitrite: NEGATIVE
PROTEIN: NEGATIVE mg/dL
Specific Gravity, Urine: 1.015 (ref 1.005–1.030)
Urobilinogen, UA: 1 mg/dL (ref 0.0–1.0)
pH: 6.5 (ref 5.0–8.0)

## 2015-01-07 LAB — LACTIC ACID, PLASMA
Lactic Acid, Venous: 2.1 mmol/L (ref 0.5–2.0)
Lactic Acid, Venous: 1 mmol/L (ref 0.5–2.0)

## 2015-01-07 LAB — APTT: APTT: 30 s (ref 24–37)

## 2015-01-07 LAB — PROCALCITONIN: Procalcitonin: <0.1 ng/mL

## 2015-01-07 LAB — PROTIME-INR
INR: 1.16 (ref 0.00–1.49)
Prothrombin Time: 15 seconds (ref 11.6–15.2)

## 2015-01-07 LAB — CBC
HCT: 41.2 % (ref 39.0–52.0)
Hemoglobin: 13.8 g/dL (ref 13.0–17.0)
MCH: 27.6 pg (ref 26.0–34.0)
MCHC: 33.5 g/dL (ref 30.0–36.0)
MCV: 82.4 fL (ref 78.0–100.0)
PLATELETS: 186 10*3/uL (ref 150–400)
RBC: 5 MIL/uL (ref 4.22–5.81)
RDW: 13.2 % (ref 11.5–15.5)
WBC: 16.1 10*3/uL — AB (ref 4.0–10.5)

## 2015-01-07 LAB — CREATININE, SERUM: CREATININE: 1.22 mg/dL (ref 0.61–1.24)

## 2015-01-07 LAB — PSA: PSA: 1.5 ng/mL (ref 0.00–4.00)

## 2015-01-07 LAB — GLUCOSE, CAPILLARY
GLUCOSE-CAPILLARY: 93 mg/dL (ref 65–99)
Glucose-Capillary: 111 mg/dL — ABNORMAL HIGH (ref 65–99)

## 2015-01-07 MED ORDER — INSULIN ASPART 100 UNIT/ML ~~LOC~~ SOLN: 0.0000 [IU] | Freq: Three times a day (TID) | SUBCUTANEOUS | Status: DC

## 2015-01-07 MED ORDER — DEXTROSE 5 % IV SOLN
2.0000 g | Freq: Three times a day (TID) | INTRAVENOUS | Status: DC
  Administered 2015-01-07 – 2015-01-08 (×3): 2 g via INTRAVENOUS
  Filled 2015-01-07 (×3): qty 2

## 2015-01-07 MED ORDER — METRONIDAZOLE IN NACL 5-0.79 MG/ML-% IV SOLN
500.0000 mg | Freq: Three times a day (TID) | INTRAVENOUS | Status: DC
  Administered 2015-01-07 – 2015-01-08 (×3): 500 mg via INTRAVENOUS
  Filled 2015-01-07 (×3): qty 100

## 2015-01-07 MED ORDER — METRONIDAZOLE IN NACL 5-0.79 MG/ML-% IV SOLN
500.0000 mg | Freq: Once | INTRAVENOUS | Status: AC
  Administered 2015-01-07: 500 mg via INTRAVENOUS
  Filled 2015-01-07: qty 100

## 2015-01-07 MED ORDER — SODIUM CHLORIDE 0.9 % IV SOLN
INTRAVENOUS | Status: DC
Start: 2015-01-07 — End: 2015-01-08
  Administered 2015-01-07: 14:00:00 via INTRAVENOUS

## 2015-01-07 MED ORDER — BUPIVACAINE HCL (PF) 0.5 % IJ SOLN
20.0000 mL | Freq: Once | INTRAMUSCULAR | Status: AC
  Administered 2015-01-07: 20 mL
  Filled 2015-01-07: qty 30

## 2015-01-07 MED ORDER — METRONIDAZOLE IN NACL 5-0.79 MG/ML-% IV SOLN: 500.0000 mg | Freq: Once | INTRAVENOUS | Status: DC

## 2015-01-07 MED ORDER — VANCOMYCIN HCL IN DEXTROSE 750-5 MG/150ML-% IV SOLN
750.0000 mg | Freq: Two times a day (BID) | INTRAVENOUS | Status: DC
  Administered 2015-01-07 – 2015-01-08 (×2): 750 mg via INTRAVENOUS
  Filled 2015-01-07 (×3): qty 150

## 2015-01-07 MED ORDER — HYDROMORPHONE HCL 1 MG/ML IJ SOLN
0.5000 mg | INTRAMUSCULAR | Status: DC | PRN
  Administered 2015-01-07: 0.5 mg via INTRAVENOUS
  Filled 2015-01-07: qty 1

## 2015-01-07 MED ORDER — ONDANSETRON HCL 4 MG PO TABS: 4.0000 mg | ORAL_TABLET | Freq: Four times a day (QID) | ORAL | Status: DC | PRN

## 2015-01-07 MED ORDER — FENTANYL CITRATE (PF) 100 MCG/2ML IJ SOLN
50.0000 ug | Freq: Once | INTRAMUSCULAR | Status: AC
  Administered 2015-01-07: 50 ug via INTRAMUSCULAR
  Filled 2015-01-07: qty 2

## 2015-01-07 MED ORDER — VANCOMYCIN HCL IN DEXTROSE 1-5 GM/200ML-% IV SOLN
1000.0000 mg | Freq: Once | INTRAVENOUS | Status: AC
  Administered 2015-01-07: 1000 mg via INTRAVENOUS
  Filled 2015-01-07: qty 200

## 2015-01-07 MED ORDER — LIDOCAINE-EPINEPHRINE 2 %-1:100000 IJ SOLN
INTRAMUSCULAR | Status: AC
  Filled 2015-01-07: qty 1

## 2015-01-07 MED ORDER — IOHEXOL 300 MG/ML  SOLN
100.0000 mL | Freq: Once | INTRAMUSCULAR | Status: AC | PRN
  Administered 2015-01-07: 100 mL via INTRAVENOUS

## 2015-01-07 MED ORDER — ONDANSETRON HCL 4 MG/2ML IJ SOLN: 4.0000 mg | Freq: Four times a day (QID) | INTRAMUSCULAR | Status: DC | PRN

## 2015-01-07 MED ORDER — DEXTROSE 5 % IV SOLN: 2.0000 g | Freq: Once | INTRAVENOUS | Status: DC

## 2015-01-07 MED ORDER — IOHEXOL 300 MG/ML  SOLN
50.0000 mL | Freq: Once | INTRAMUSCULAR | Status: AC | PRN
  Administered 2015-01-07: 50 mL via ORAL

## 2015-01-07 MED ORDER — SODIUM CHLORIDE 0.9 % IV SOLN
1000.0000 mL | INTRAVENOUS | Status: DC
  Administered 2015-01-07: 1000 mL via INTRAVENOUS

## 2015-01-07 MED ORDER — HYDROCODONE-ACETAMINOPHEN 5-325 MG PO TABS
1.0000 | ORAL_TABLET | ORAL | Status: DC | PRN
  Administered 2015-01-07 – 2015-01-08 (×2): 2 via ORAL
  Filled 2015-01-07 (×2): qty 2

## 2015-01-07 MED ORDER — FENTANYL CITRATE (PF) 100 MCG/2ML IJ SOLN
50.0000 ug | Freq: Once | INTRAMUSCULAR | Status: AC
  Administered 2015-01-07: 50 ug via INTRAVENOUS
  Filled 2015-01-07: qty 2

## 2015-01-07 MED ORDER — DEXTROSE 5 % IV SOLN
1.0000 g | Freq: Once | INTRAVENOUS | Status: AC
  Administered 2015-01-07: 1 g via INTRAVENOUS
  Filled 2015-01-07: qty 1

## 2015-01-07 MED ORDER — SODIUM CHLORIDE 0.9 % IV SOLN
1000.0000 mL | Freq: Once | INTRAVENOUS | Status: AC
  Administered 2015-01-07: 1000 mL via INTRAVENOUS

## 2015-01-07 MED ORDER — VANCOMYCIN HCL 10 G IV SOLR
1250.0000 mg | Freq: Once | INTRAVENOUS | Status: DC
  Filled 2015-01-07: qty 1250

## 2015-01-07 MED ORDER — ENOXAPARIN SODIUM 40 MG/0.4ML ~~LOC~~ SOLN
40.0000 mg | SUBCUTANEOUS | Status: DC
  Administered 2015-01-07: 40 mg via SUBCUTANEOUS
  Filled 2015-01-07 (×2): qty 0.4

## 2015-01-07 NOTE — ED Notes (Signed)
Dr. At bedside lancing boil.

## 2015-01-07 NOTE — ED Notes (Signed)
Waiting on a pump

## 2015-01-07 NOTE — Progress Notes (Signed)
ANTIBIOTIC CONSULT NOTE - INITIAL  Pharmacy Consult for Vancomycin, Aztreonam, Metronidazole Indication: Perineal abscess  Allergies  Allergen Reactions  . Aspirin Anaphylaxis  . Cephalexin Anaphylaxis  . Penicillins Anaphylaxis  . Flomax [Tamsulosin Hcl] Other (See Comments)    Causes blisters    Patient Measurements: Height:  (172.7 cm) Weight: 237 lb 11.2 oz (107.82 kg) IBW/kg (Calculated) : 68.4  Vital Signs: Temp: 98.3 F (36.8 C) (08/25 1156) Temp Source: Oral (08/25 1156) BP: 120/80 mmHg (08/25 1156) Pulse Rate: 81 (08/25 1156) Intake/Output from previous day:   Intake/Output from this shift:    Labs:  Recent Labs  01/07/15 0221 01/07/15 1245  WBC 11.6* 16.1*  HGB 14.3 13.8  PLT 174 186  CREATININE 1.34* 1.22   Estimated Creatinine Clearance: 77.6 mL/min (by C-G formula based on Cr of 1.22). No results for input(s): VANCOTROUGH, VANCOPEAK, VANCORANDOM, GENTTROUGH, GENTPEAK, GENTRANDOM, TOBRATROUGH, TOBRAPEAK, TOBRARND, AMIKACINPEAK, AMIKACINTROU, AMIKACIN in the last 72 hours.   Microbiology: No results found for this or any previous visit (from the past 720 hour(s)).  Medical History: Past Medical History  Diagnosis Date  . Hernia   . Diabetes mellitus without complication   . Asthma     as a child     Assessment: 23 y/oM with PMH of DM who presented with 4-day history of groin pain, fevers, chills, and sweats. Patient underwent I&D of abscess this AM. CT scan subsequently showed residual perineal abscess. Pharmacy consulted to assist with dosing of Vancomycin, Aztreonam, Metronidazole for perineal abscess. First doses were given in the ED this morning.  8/25 >> Vancomycin >> 8/25 >> Aztreonam >>  8/25 >> Metronidazole >>   8/25 blood x 2: sent  Goal of Therapy:  Vancomycin trough level 15-20 mcg/ml  Appropriate antibiotic dosing for renal function and indication Eradication of infection  Plan:   Vancomycin 1g IV x 1 given in ED at  0626. Continue with maintenance dose of  IV q12h to start now, as patient did not receive full loading dose in ED.  Plan for Vancomycin trough level at steady state.  Aztreonam 2g IV q8h  Metronidazole  IV q8h  Monitor renal function, cultures, clinical course.   Greer Pickerel, PharmD, BCPS Pager: [phone] 01/07/2015 1:54 PM

## 2015-01-07 NOTE — Discharge Instructions (Signed)
COME BACK TO THE ER IMMEDIATELY.  JUST GIVE TRIAGE THE PAPERWORK AND ASK THEM TO CALL ME (Belton Peplinski) SO THAT WE CAN GET YOU ADMITTED.   Abscess An abscess is an infected area that contains a collection of pus and debris.It can occur in almost any part of the body. An abscess is also known as a furuncle or boil. CAUSES  An abscess occurs when tissue gets infected. This can occur from blockage of oil or sweat glands, infection of hair follicles, or a minor injury to the skin. As the body tries to fight the infection, pus collects in the area and creates pressure under the skin. This pressure causes pain. People with weakened immune systems have difficulty fighting infections and get certain abscesses more often.  SYMPTOMS Usually an abscess develops on the skin and becomes a painful mass that is red, warm, and tender. If the abscess forms under the skin, you may feel a moveable soft area under the skin. Some abscesses break open (rupture) on their own, but most will continue to get worse without care. The infection can spread deeper into the body and eventually into the bloodstream, causing you to feel ill.  DIAGNOSIS  Your caregiver will take your medical history and perform a physical exam. A sample of fluid may also be taken from the abscess to determine what is causing your infection. TREATMENT  Your caregiver may prescribe antibiotic medicines to fight the infection. However, taking antibiotics alone usually does not cure an abscess. Your caregiver may need to make a small cut (incision) in the abscess to drain the pus. In some cases, gauze is packed into the abscess to reduce pain and to continue draining the area. HOME CARE INSTRUCTIONS   Only take over-the-counter or prescription medicines for pain, discomfort, or fever as directed by your caregiver.  If you were prescribed antibiotics, take them as directed. Finish them even if you start to feel better.  If gauze is used, follow  your caregiver's directions for changing the gauze.  To avoid spreading the infection:  Keep your draining abscess covered with a bandage.  Wash your hands well.  Do not share personal care items, towels, or whirlpools with others.  Avoid skin contact with others.  Keep your skin and clothes clean around the abscess.  Keep all follow-up appointments as directed by your caregiver. SEEK MEDICAL CARE IF:   You have increased pain, swelling, redness, fluid drainage, or bleeding.  You have muscle aches, chills, or a general ill feeling.  You have a fever. MAKE SURE YOU:   Understand these instructions.  Will watch your condition.  Will get help right away if you are not doing well or get worse. Document Released: 02/08/2005 Document Revised: 10/31/2011 Document Reviewed: 07/14/2011 [facility] Patient Information 2015 [place], [place]. This information is not intended to replace advice given to you by your health care provider. Make sure you discuss any questions you have with your health care provider.  Cellulitis Cellulitis is an infection of the skin and the tissue beneath it. The infected area is usually red and tender. Cellulitis occurs most often in the arms and lower legs.  CAUSES  Cellulitis is caused by bacteria that enter the skin through cracks or cuts in the skin. The most common types of bacteria that cause cellulitis are staphylococci and streptococci. SIGNS AND SYMPTOMS   Redness and warmth.  Swelling.  Tenderness or pain.  Fever. DIAGNOSIS  Your health care provider can usually determine what is  wrong based on a physical exam. Blood tests may also be done. TREATMENT  Treatment usually involves taking an antibiotic medicine. HOME CARE INSTRUCTIONS   Take your antibiotic medicine as directed by your health care provider. Finish the antibiotic even if you start to feel better.  Keep the infected arm or leg elevated to reduce swelling.  Apply a warm cloth to  the affected area up to 4 times per day to relieve pain.  Take medicines only as directed by your health care provider.  Keep all follow-up visits as directed by your health care provider. SEEK MEDICAL CARE IF:   You notice red streaks coming from the infected area.  Your red area gets larger or turns dark in color.  Your bone or joint underneath the infected area becomes painful after the skin has healed.  Your infection returns in the same area or another area.  You notice a swollen bump in the infected area.  You develop new symptoms.  You have a fever. SEEK IMMEDIATE MEDICAL CARE IF:   You feel very sleepy.  You develop vomiting or diarrhea.  You have a general ill feeling (malaise) with muscle aches and pains. MAKE SURE YOU:   Understand these instructions.  Will watch your condition.  Will get help right away if you are not doing well or get worse. Document Released: 02/08/2005 Document Revised: 09/15/2013 Document Reviewed: 07/17/2011 [facility] Patient Information 2015 [place], [place]. This information is not intended to replace advice given to you by your health care provider. Make sure you discuss any questions you have with your health care provider.

## 2015-01-07 NOTE — Op Note (Signed)
Preop Dx:  Incompletely drained perineal abscess  Postop Dx:  Same  Procedure:  Incision and Drainage of perineal abscess  Surgeon:  Abbey Chatters  Anesthesia:  Local (Xylocaine)  Technique:  The perineal area was sterilely prepped and anesthetized.  During administration of the local anesthetic, purulent drainage was noted coming from the previous small incision.  A cruciate incision was made and the corners cut.  A small amount of purulent fluid was evacuated.  Abscess cavity tracked superiorly.  No loculations were encountered.  The wound was packed with moist gauze.  He tolerated the procedure well and there were no apparent complications.

## 2015-01-07 NOTE — Consult Note (Signed)
Reason for Consult: left perineal abscess Referring Physician: Dr. Mart Piggs  Brandon Owens is an 59 y.o. male.  HPI: Brandon Owens is a 59 year old male with a history of diabetes mellitus presented with 4 day history of groin pain.  Initially had a "Small lump" which he "popped"  2 days ago and expressed blood.  The pain worsened and he developed fevers, chills and sweats.  Appetite has been poor and CBGs have been stable.  He reports previous abscess requiring I&D.  Denies a history of hydradenitis.  Unfortunately, he smokes.  The patient had an I&D by EDP and then a CT scan which showed a residual perineal abscess.  We were therefore consulted.  WBC 11.6.  Afebrile since admission.    Past Medical History  Diagnosis Date  . Hernia   . Diabetes mellitus without complication   . Asthma     as a child    Past Surgical History  Procedure Laterality Date  . Cystectomy    . Hemorrhoid surgery      Family History  Problem Relation Age of Onset  . Diabetes Other   . Heart failure Father     Social History:  reports that he has been smoking.  He has never used smokeless tobacco. He reports that he does not drink alcohol or use illicit drugs.  Allergies:  Allergies  Allergen Reactions  . Aspirin Anaphylaxis  . Cephalexin Anaphylaxis  . Penicillins Anaphylaxis  . Flomax [Tamsulosin Hcl] Other (See Comments)    Causes blisters    Medications:  No current facility-administered medications on file prior to encounter.   Current Outpatient Prescriptions on File Prior to Encounter  Medication Sig Dispense Refill  . clotrimazole (ANTI-FUNGAL) 1 % cream Apply 1 application topically 2 (two) times daily.    . metFORMIN (GLUCOPHAGE) 500 MG tablet Take 1 tablet (500 mg total) by mouth 2 (two) times daily with a meal. 60 tablet 3  . Omega-3 Fatty Acids (OMEGA-3 PO) Take 1 capsule by mouth 2 (two) times daily.     Marland Kitchen OVER THE COUNTER MEDICATION Take 1 tablet by mouth daily.    Marland Kitchen  OVER THE COUNTER MEDICATION Take 1 tablet by mouth at bedtime as needed (sleep).    . Probiotic Product (PROBIOTIC DAILY PO) Take 1 tablet by mouth.    Marland Kitchen albuterol (PROVENTIL HFA;VENTOLIN HFA) 108 (90 BASE) MCG/ACT inhaler Inhale 2 puffs into the lungs every 4 (four) hours as needed for wheezing or shortness of breath (or cough). 1 Inhaler 0  . gabapentin (NEURONTIN) 300 MG capsule Take 1 capsule (300 mg total) by mouth at bedtime. (Patient not taking: Reported on 01/07/2015) 30 capsule 2  . lisinopril (PRINIVIL,ZESTRIL) 5 MG tablet Take 1 tablet (5 mg total) by mouth daily. (Patient not taking: Reported on 01/07/2015) 90 tablet 3  . [DISCONTINUED] pantoprazole (PROTONIX) 40 MG tablet Take 1 tablet (40 mg total) by mouth daily. 30 tablet 0     Results for orders placed or performed during the hospital encounter of 01/07/15 (from the past 48 hour(s))  Basic metabolic panel     Status: Abnormal   Collection Time: 01/07/15  2:21 AM  Result Value Ref Range   Sodium 137 135 - 145 mmol/L   Potassium 4.0 3.5 - 5.1 mmol/L   Chloride 102 101 - 111 mmol/L   CO2 28 22 - 32 mmol/L   Glucose, Bld 92 65 - 99 mg/dL   BUN 13 6 - 20 mg/dL  Creatinine, Ser 1.34 (H) 0.61 - 1.24 mg/dL   Calcium 8.7 (L) 8.9 - 10.3 mg/dL   GFR calc non Af Amer 56 (L) >60 mL/min   GFR calc Af Amer >60 >60 mL/min    Comment: (NOTE) The eGFR has been calculated using the CKD EPI equation. This calculation has not been validated in all clinical situations. eGFR's persistently <60 mL/min signify possible Chronic Kidney Disease.    Anion gap 7 5 - 15  CBC with Differential     Status: Abnormal   Collection Time: 01/07/15  2:21 AM  Result Value Ref Range   WBC 11.6 (H) 4.0 - 10.5 K/uL   RBC 5.15 4.22 - 5.81 MIL/uL   Hemoglobin 14.3 13.0 - 17.0 g/dL   HCT 42.2 39.0 - 52.0 %   MCV 81.9 78.0 - 100.0 fL   MCH 27.8 26.0 - 34.0 pg   MCHC 33.9 30.0 - 36.0 g/dL   RDW 12.9 11.5 - 15.5 %   Platelets 174 150 - 400 K/uL    Neutrophils Relative % 53 43 - 77 %   Neutro Abs 6.2 1.7 - 7.7 K/uL   Lymphocytes Relative 34 12 - 46 %   Lymphs Abs 4.0 0.7 - 4.0 K/uL   Monocytes Relative 11 3 - 12 %   Monocytes Absolute 1.3 (H) 0.1 - 1.0 K/uL   Eosinophils Relative 2 0 - 5 %   Eosinophils Absolute 0.2 0.0 - 0.7 K/uL   Basophils Relative 0 0 - 1 %   Basophils Absolute 0.0 0.0 - 0.1 K/uL  CBG monitoring, ED     Status: None   Collection Time: 01/07/15  2:45 AM  Result Value Ref Range   Glucose-Capillary 95 65 - 99 mg/dL   Comment 1 Notify RN    Comment 2 Document in Chart   Urinalysis, Routine w reflex microscopic (not at [facility])     Status: None   Collection Time: 01/07/15  2:58 AM  Result Value Ref Range   Color, Urine YELLOW YELLOW   APPearance CLEAR CLEAR   Specific Gravity, Urine 1.015 1.005 - 1.030   pH 6.5 5.0 - 8.0   Glucose, UA NEGATIVE NEGATIVE mg/dL   Hgb urine dipstick NEGATIVE NEGATIVE   Bilirubin Urine NEGATIVE NEGATIVE   Ketones, ur NEGATIVE NEGATIVE mg/dL   Protein, ur NEGATIVE NEGATIVE mg/dL   Urobilinogen, UA 1.0 0.0 - 1.0 mg/dL   Nitrite NEGATIVE NEGATIVE   Leukocytes, UA NEGATIVE NEGATIVE    Comment: MICROSCOPIC NOT DONE ON URINES WITH NEGATIVE PROTEIN, BLOOD, LEUKOCYTES, NITRITE, OR GLUCOSE <1000 mg/dL.    Ct Abdomen Pelvis W Contrast  01/07/2015   CLINICAL DATA:  Scrotal abscess  EXAM: CT ABDOMEN AND PELVIS WITH CONTRAST  TECHNIQUE: Multidetector CT imaging of the abdomen and pelvis was performed using the standard protocol following bolus administration of intravenous contrast.  CONTRAST:  100 cc Omnipaque 300  COMPARISON:  None.  FINDINGS: Mild diffuse hepatic steatosis  Gallbladder, spleen, pancreas, adrenal glands are within normal limits.  Small hypodensities in the kidneys are too small to characterize. There is a small amount of right perinephric fluid. No evidence of hydronephrosis.  Normal appendix.  The prostate is enlarged and lobulated extending through the base of the bladder. It  is 5.9 x 5.6 x 5.5 cm. Several pelvic phleboliths are noted.  No free-fluid. No abnormal retroperitoneal adenopathy by measurement criteria.  Mild L5-S1 degenerative disc disease. No vertebral compression deformity.  IMPRESSION: Mild right perinephric fluid. Correlate with  urinalysis as for the possibility of an inflammatory process.  Prostate is enlarged.  Correlate with PSA and physical exam.   Electronically Signed   By: Marybelle Killings M.D.   On: 01/07/2015 07:41    Review of Systems  All other systems reviewed and are negative.  There were no vitals taken for this visit. Physical Exam  Constitutional: He appears well-developed and well-nourished. No distress.  Cardiovascular: Normal rate, regular rhythm, normal heart sounds and intact distal pulses.  Exam reveals no gallop and no friction rub.   No murmur heard. Respiratory: Effort normal and breath sounds normal. He has no wheezes.  GI: Soft. Bowel sounds are normal. There is no tenderness.  Genitourinary:  Perineal tenderness with induration.  There is a left inguinal induration, but no purulent fluid was aspirated.    Skin: He is not diaphoretic.    Assessment/Plan: Perineal abscess-bedside I&D(refer to procedure note).  Plan to remove packing in AM and start TID sitz baths, antibiotics.  I encouraged him to stop smoking, discussed supportive care to help minimize infections.  His last A1c was 5.9 in March of 2016.   ID-may change to PO antibiotics per medicine  Right perinephric fluid-per medical team    Oneida Arenas [employer] ANP-BC 01/07/2015, 11:58 AM

## 2015-01-07 NOTE — ED Provider Notes (Signed)
  Physical Exam  BP 121/80 mmHg  Pulse 80  Temp(Src) 98.5 F (36.9 C) (Oral)  Resp 18  Ht  (1.727 m)  Wt 240 lb (108.863 kg)  BMI 36.50 kg/m2  SpO2 98%  Physical Exam  ED Course  Procedures  MDM  PT WITH GROIN ABSCESS. HAS RESIDUAL INDURATION AND SWELLING, POST DRAINAGE. CT SCAN SHOWS ABSCESS. NO NECROTIZING INFECTION. PT HAS ANAPHYLAXIS TO CEPHALOSPORIN AND PENICILLIN AND HAS BEEN GIVEN VANC AND FLAGYL.  WE CONSULTED SURGERY AND WANTED PATIENT TO GET ADMITTED. PATIENT HAS SOME DOMESTIC OBLIGATIONS AND WANTS TO LEAVE NOW AND RETURN IN 1 HOUR.  DR. Izola Price WILL ADMIT PT WHEN HE GETS HERE, AND SHE HAS DECIDED TO GIVE HIM ADDITIONAL ANTIBIOTICS FOR GRAM NEG.  Derwood Kaplan, MD 01/07/15 [phone]

## 2015-01-07 NOTE — H&P (Signed)
[facility] History and Physical  Brandon Owens ZOX:[id number] DOB: Sep 15, 1955 DOA: (Not on file)  Referring physician: ED physician, Dr. Salina April  PCP: Ambrose Finland, NP   Chief Complaint: groin pain under the scrotum   HPI:  Patient is 59 year old male with diabetes type 2, smokes 1 pack per day over 20 years, hypertension, COPD, presented to [facility] emergency department with main concern of 4 days duration of progressively worsening groin pain, intermittent in nature, throbbing, 5/10 in severity when present, non radiating, associated with small lump which patient reports popped 2 days prior to this admission and he noted small amount of pus and blood coming from it. Patient now reports developing fevers and chills at home, has had poor oral intake, has been checking his sugar levels and says it has been stable. Patient denies chest pain or shortness of breath, no abdominal pain, no urinary concerns.  In emergency department, vital signs notable for T 99.97F, HR up to 102, WBC 16 K. ED doctor performed I&D, CT scan showed residual perineal abscess. TRH asked to admit for further management, surgery team consulted for assistance with repeat I&D. Patient was covered broadly with vancomycin, aztreonam, Flagyl in emergency department.  Assessment and Plan:  Principal Problem:   Sepsis secondary to perineal abscess  - Appreciate surgery team assistance with I&D - Patient meets criteria for sepsis with HR up to 102, WBC 16 K, source likely groin abscess - covered broadly with ABX initially: Vancomycin, Flagyl, aztreonam - Readjust antibody regimen as clinically indicated as quick as possible, possibly in AM change to PO  - Provide IV fluids for 12 hours  - provide analgesia as needed  - follow up on blood cultures, procalcitonin and lactic acid, repeat CBC in AM  Active Problems:  Right perinephric fluid on CT scan  - UA is unremarkable but pt did report some dysuria and  urinary urgency, frequency  - urine culture already requested in ED - he is already on ABX, would change to PO in next 24 hours if WBC continue trending down    Diabetes mellitus type 2 with neurological manifestations - CBGs stable on admission - Last A1C March 2016, was 5.9 but A1C has been as high as 12 in the past year - We'll recheck A1c, hold metformin until renal function stabilizes - Provide sliding scale insulin   Smoking - Patient counseled on smoking cessation and verbalizes understanding   HTN - Blood pressure stable on admission, hold lisinopril until Cr stabilizes    Enlarged prostate - monitor UOP - ordered PSA as recommended    Obesity - BMI > 30    DVT prophylaxis - Lovenox SQ  Radiological Exams on Admission: Ct Abdomen Pelvis W Contrast 01/07/2015  Mild right perinephric fluid. Correlate with urinalysis as for the possibility of an inflammatory process.  Prostate is enlarged.  Correlate with PSA and physical exam.     Code Status: Full Family Communication: Pt at bedside Disposition Plan: Admit for further evaluation    Danie Binder [facility] [phone]   Review of Systems:  Constitutional: Negative for diaphoresis.  HENT: Negative for hearing loss, ear pain, nosebleeds, congestion, sore throat, neck pain, tinnitus and ear discharge.   Eyes: Negative for blurred vision, double vision, photophobia, pain, discharge and redness.  Respiratory: Negative for cough, hemoptysis, sputum production, shortness of breath, wheezing and stridor.   Cardiovascular: Negative for chest pain, palpitations, orthopnea, claudication and leg swelling.  Gastrointestinal:  Negative for heartburn, constipation, blood in  stool and melena.  Genitourinary: Negative for dysuria, urgency, and flank pain.  Musculoskeletal: Negative for myalgias, back pain, joint pain and falls.  Skin: Negative for itching and rash.  Neurological: Negative for dizziness and weakness Endo/Heme/Allergies: Negative  for environmental allergies and polydipsia. Does not bruise/bleed easily.  Psychiatric/Behavioral: Negative for suicidal ideas. The patient is not nervous/anxious.      Past Medical History  Diagnosis Date  . Hernia   . Diabetes mellitus without complication   . Asthma     as a child    Past Surgical History  Procedure Laterality Date  . Cystectomy    . Hemorrhoid surgery      Social History:  reports that he has been smoking.  He has never used smokeless tobacco. He reports that he does not drink alcohol or use illicit drugs.  Allergies  Allergen Reactions  . Aspirin Anaphylaxis  . Cephalexin Anaphylaxis  . Penicillins Anaphylaxis  . Flomax [Tamsulosin Hcl] Other (See Comments)    Causes blisters    Family History  Problem Relation Age of Onset  . Diabetes Other   . Heart failure Father     Medication Sig  albuterol (PROVENTIL HFA;VENTOLIN HFA) 108 (90 BASE) MCG/ACT inhaler Inhale 2 puffs into the lungs every 4 (four) hours as needed for wheezing or shortness of breath (or cough).  clotrimazole (ANTI-FUNGAL) 1 % cream Apply 1 application topically 2 (two) times daily.  gabapentin (NEURONTIN) 300 MG capsule Take 1 capsule (300 mg total) by mouth at bedtime. Patient not taking: Reported on 01/07/2015  lisinopril (PRINIVIL,ZESTRIL) 5 MG tablet Take 1 tablet (5 mg total) by mouth daily. Patient not taking: Reported on 01/07/2015  metFORMIN (GLUCOPHAGE) 500 MG tablet Take 1 tablet (500 mg total) by mouth 2 (two) times daily with a meal.  Omega-3 Fatty Acids (OMEGA-3 PO) Take 1 capsule by mouth 2 (two) times daily.   OVER THE COUNTER MEDICATION Take 1 tablet by mouth daily.  OVER THE COUNTER MEDICATION Take 1 tablet by mouth at bedtime as needed (sleep).  Probiotic Product (PROBIOTIC DAILY PO) Take 1 tablet by mouth.    Physical Exam: There were no vitals filed for this visit.  Physical Exam  Constitutional: Appears well-developed and well-nourished. No distress.  HENT:  Normocephalic. External right and left ear normal. Oropharynx is clear and moist.  Eyes: Conjunctivae and EOM are normal. PERRLA, no scleral icterus.  Neck: Normal ROM. Neck supple. No JVD. No tracheal deviation. No thyromegaly.  CVS: RRR, S1/S2 +, no murmurs, no gallops, no carotid bruit.  Pulmonary: Effort and breath sounds normal, no stridor, rhonchi, wheezes, rales.  Abdominal: Soft. BS +,  no distension, tenderness, rebound or guarding.  Musculoskeletal: Normal range of motion. No edema and no tenderness.  Lymphadenopathy: No lymphadenopathy noted, cervical, inguinal. Neuro: Alert. Normal reflexes, muscle tone coordination. No cranial nerve deficit. Rectal: Perineal tenderness with induration, left inguinal induration, erythema and warmth to touch  Psychiatric: Normal mood and affect. Behavior, judgment, thought content normal.   Labs on Admission:  Basic Metabolic Panel:  Recent Labs Lab 01/07/15 0221  NA 137  K 4.0  CL 102  CO2 28  GLUCOSE 92  BUN 13  CREATININE 1.34*  CALCIUM 8.7*   CBC:  Recent Labs Lab 01/07/15 0221  WBC 11.6*  NEUTROABS 6.2  HGB 14.3  HCT 42.2  MCV 81.9  PLT 174   CBG:  Recent Labs Lab 01/07/15 0245  GLUCAP 95    EKG: pending  If 7PM-7AM, please contact night-coverage [email or web address] Password [facility] 01/07/2015, 10:27 AM

## 2015-01-07 NOTE — ED Notes (Signed)
Patient to be discharged to go home and find someone to care for his dog.  Patient will return to be admitted after that.  Dr. Rhunette Croft and hospitalist aware and have spoken with patient.

## 2015-01-07 NOTE — ED Notes (Signed)
Pt told to call if he could provide a urine sample. Urinal given.

## 2015-01-07 NOTE — Progress Notes (Addendum)
Cm went to speak with pt about his present pcp services but not present in assigned [facility] ED room PT3

## 2015-01-07 NOTE — ED Notes (Signed)
Patient is having trouble urinating. Patient is complaining of pain under his scrotum. Patient states he has had this pain for 3 to 4 days. Patient is also complaining of fever. Patient says he thinks a boil is under scrotum. Patient is complaining of heart flutters, headaches, and sinus pressure.

## 2015-01-07 NOTE — Progress Notes (Signed)
CRITICAL VALUE ALERT  Critical value received:  Lactic Acid  Date of notification:  01/07/2015  Time of notification:  1400  Critical value read back: yes  Nurse who received alert:  Philomena Doheny  MD notified (1st page):  Dr. Izola Price  Time of first page:  1440  MD notified (2nd page):  Time of second page:  Responding MD:  Dr. Izola Price  Time MD responded:  1440

## 2015-01-07 NOTE — ED Provider Notes (Signed)
CSN: [id number]   Arrival date & time 01/07/15 0121  History  This chart was scribed for Devoria Albe, MD by Bethel Born, ED Scribe. This patient was seen in room WA23/WA23 and the patient's care was started at 1:50 AM.  Chief Complaint  Patient presents with  . Groin Pain    Patient is having trouble urinating. Patient is complaining of pain under his scrotum. Patient states he has had this pain for 3 to 4 days. Patient is also complaining of fever. Patient says he thinks a boil is under scrotum. Patient is complaining of heart flutters, headaches, and sinus pressure.    HPI The history is provided by the patient. No language interpreter was used.   Brandon Owens is a 59 y.o. male with PMHx of DM who presents to the Emergency Department complaining of painful abscess between the rectum and scrotum with gradual onset 3 days ago. He has had abscesses in the past but not near the scrotum. Associated symptoms include subjective fever for 3 days with chills, dysuria, and increased urinary frequency. Also complains of fluttering palpitations for 2 weeks. Pt denies drainage from the abscess, nausea, vomiting, and diarrhea. He has not checked his blood sugar but feels that it is well controlled. No urology follow up.  Smokes 1 PPD. No alcohol or tobacco use.   PCP [facility] No urology  Past Medical History  Diagnosis Date  . Hernia   . Diabetes mellitus without complication   . Asthma     as a child    Past Surgical History  Procedure Laterality Date  . Cystectomy    . Hemorrhoid surgery      Family History  Problem Relation Age of Onset  . Diabetes Other   . Heart failure Father     Social History  Substance Use Topics  . Smoking status: Current Every Day Smoker -- 1.00 packs/day  . Smokeless tobacco: Never Used  . Alcohol Use: No   employed   Review of Systems  Constitutional: Positive for fever (subjective) and chills.  Cardiovascular: Positive for palpitations.   Gastrointestinal: Negative for abdominal pain.  Genitourinary: Positive for dysuria and frequency.       Abscess between the rectum and scrotum  All other systems reviewed and are negative.   Home Medications   Prior to Admission medications   Medication Sig Start Date End Date Taking? Authorizing Provider  clotrimazole (ANTI-FUNGAL) 1 % cream Apply 1 application topically 2 (two) times daily.   Yes Historical Provider, MD  metFORMIN (GLUCOPHAGE) 500 MG tablet Take 1 tablet (500 mg total) by mouth 2 (two) times daily with a meal. 07/29/14  Yes Jaclyn Shaggy, MD  Omega-3 Fatty Acids (OMEGA-3 PO) Take 1 capsule by mouth 2 (two) times daily.    Yes Historical Provider, MD  OVER THE COUNTER MEDICATION Take 1 tablet by mouth daily.   Yes Historical Provider, MD  OVER THE COUNTER MEDICATION Take 1 tablet by mouth at bedtime as needed (sleep).   Yes Historical Provider, MD  Probiotic Product (PROBIOTIC DAILY PO) Take 1 tablet by mouth.   Yes Historical Provider, MD  albuterol (PROVENTIL HFA;VENTOLIN HFA) 108 (90 BASE) MCG/ACT inhaler Inhale 2 puffs into the lungs every 4 (four) hours as needed for wheezing or shortness of breath (or cough). 12/02/11 12/01/12  Rise Patience, PA-C  gabapentin (NEURONTIN) 300 MG capsule Take 1 capsule (300 mg total) by mouth at bedtime. Patient not taking: Reported on 01/07/2015 07/29/14   Enobong  Amao, MD  lisinopril (PRINIVIL,ZESTRIL) 5 MG tablet Take 1 tablet (5 mg total) by mouth daily. Patient not taking: Reported on 01/07/2015 07/29/14   Jaclyn Shaggy, MD    Allergies  Aspirin; Cephalexin; Penicillins; and Flomax  Triage Vitals: BP 119/75 mmHg  Pulse 102  Temp(Src) 99.5 F (37.5 C) (Oral)  Resp 18  Ht 5\' 8"  (1.727 m)  Wt 240 lb (108.863 kg)  BMI 36.50 kg/m2  SpO2 99%  Vital signs normal    Physical Exam  Constitutional: He is oriented to person, place, and time. He appears well-developed and well-nourished.  Non-toxic appearance. He does not appear ill.  No distress.  HENT:  Head: Normocephalic and atraumatic.  Right Ear: External ear normal.  Left Ear: External ear normal.  Nose: Nose normal. No mucosal edema or rhinorrhea.  Mouth/Throat: Oropharynx is clear and moist and mucous membranes are normal. No dental abscesses or uvula swelling.  Eyes: Conjunctivae and EOM are normal. Pupils are equal, round, and reactive to light.  Neck: Normal range of motion and full passive range of motion without pain. Neck supple.  Cardiovascular: Normal rate, regular rhythm and normal heart sounds.  Exam reveals no gallop and no friction rub.   No murmur heard. Pulmonary/Chest: Effort normal and breath sounds normal. No respiratory distress. He has no wheezes. He has no rhonchi. He has no rales. He exhibits no tenderness and no crepitus.  Abdominal: Soft. Normal appearance and bowel sounds are normal. He exhibits no distension. There is no tenderness. There is no rebound and no guarding.  Genitourinary:  8 cm X 3 cm indurated swollen painful area midline of the posterior scrotum. No other swelling seen. Normal external genitalia otherwise.   Musculoskeletal: Normal range of motion. He exhibits no edema or tenderness.  Moves all extremities well.   Neurological: He is alert and oriented to person, place, and time. He has normal strength. No cranial nerve deficit.  Skin: Skin is warm, dry and intact. No rash noted. No erythema. No pallor.  Psychiatric: He has a normal mood and affect. His speech is normal and behavior is normal. His mood appears not anxious.  Nursing note and vitals reviewed.   ED Course  Procedures Medications  0.9 %  sodium chloride infusion (0 mLs Intravenous Stopped 01/07/15 0627)    Followed by  0.9 %  sodium chloride infusion (1,000 mLs Intravenous New Bag/Given 01/07/15 0544)  bupivacaine (MARCAINE) 0.5 % injection 20 mL (20 mLs Infiltration Given 01/07/15 0217)  fentaNYL (SUBLIMAZE) injection 50 mcg (50 mcg Intramuscular Given  01/07/15 0217)  fentaNYL (SUBLIMAZE) injection 50 mcg (50 mcg Intravenous Given 01/07/15 0545)  vancomycin (VANCOCIN) IVPB 1000 mg/200 mL premix (1,000 mg Intravenous New Bag/Given 01/07/15 0626)  metroNIDAZOLE (FLAGYL) IVPB 500 mg (0 mg Intravenous Stopped 01/07/15 0627)  aztreonam (AZACTAM) 1 g in dextrose 5 % 50 mL IVPB (0 g Intravenous Stopped 01/07/15 0627)  iohexol (OMNIPAQUE) 300 MG/ML solution 50 mL (50 mLs Oral Contrast Given 01/07/15 0538)  iohexol (OMNIPAQUE) 300 MG/ML solution 100 mL (100 mLs Intravenous Contrast Given 01/07/15 0656)   d  DIAGNOSTIC STUDIES: Oxygen Saturation is 99% on RA, normal by my interpretation.    COORDINATION OF CARE: 1:57 AM Discussed treatment plan which includes lab work, I&D, and pain management with pt at bedside and pt agreed to plan.  After patient's ID was done patient states he did not improve his pain at all. CT of the pelvis was ordered. IV antibiotics were ordered for possible fournier's  gangrene.  He was given more pain medications.    07:33 pt left at change of shift with Dr Rhunette Croft to get his CT results.   INCISION AND DRAINAGE Performed by: Devoria Albe L Consent: Verbal consent obtained. Risks and benefits: risks, benefits and alternatives were discussed Type: abscess  Body area: inferior scotum midline  Anesthesia: local infiltration  Incision was made with a 11 blade scalpel.  Local anesthetic: Marcaine 0.5 %  Anesthetic total: 6  ml  Complexity: complex Blunt dissection to break up loculations  Drainage: purulent  Drainage amount: moderate, still has a lot of swelling afterwards.  Packing material: 1/4 in iodoform gauze  Patient tolerance: Patient tolerated the procedure well with no immediate complications.    Results for orders placed or performed during the hospital encounter of 01/07/15  Basic metabolic panel  Result Value Ref Range   Sodium 137 135 - 145 mmol/L   Potassium 4.0 3.5 - 5.1 mmol/L   Chloride 102  101 - 111 mmol/L   CO2 28 22 - 32 mmol/L   Glucose, Bld 92 65 - 99 mg/dL   BUN 13 6 - 20 mg/dL   Creatinine, Ser 1.61 (H) 0.61 - 1.24 mg/dL   Calcium 8.7 (L) 8.9 - 10.3 mg/dL   GFR calc non Af Amer 56 (L) >60 mL/min   GFR calc Af Amer >60 >60 mL/min   Anion gap 7 5 - 15  CBC with Differential  Result Value Ref Range   WBC 11.6 (H) 4.0 - 10.5 K/uL   RBC 5.15 4.22 - 5.81 MIL/uL   Hemoglobin 14.3 13.0 - 17.0 g/dL   HCT 09.6 04.5 - 40.9 %   MCV 81.9 78.0 - 100.0 fL   MCH 27.8 26.0 - 34.0 pg   MCHC 33.9 30.0 - 36.0 g/dL   RDW 81.1 91.4 - 78.2 %   Platelets 174 150 - 400 K/uL   Neutrophils Relative % 53 43 - 77 %   Neutro Abs 6.2 1.7 - 7.7 K/uL   Lymphocytes Relative 34 12 - 46 %   Lymphs Abs 4.0 0.7 - 4.0 K/uL   Monocytes Relative 11 3 - 12 %   Monocytes Absolute 1.3 (H) 0.1 - 1.0 K/uL   Eosinophils Relative 2 0 - 5 %   Eosinophils Absolute 0.2 0.0 - 0.7 K/uL   Basophils Relative 0 0 - 1 %   Basophils Absolute 0.0 0.0 - 0.1 K/uL  Urinalysis, Routine w reflex microscopic (not at [facility])  Result Value Ref Range   Color, Urine YELLOW YELLOW   APPearance CLEAR CLEAR   Specific Gravity, Urine 1.015 1.005 - 1.030   pH 6.5 5.0 - 8.0   Glucose, UA NEGATIVE NEGATIVE mg/dL   Hgb urine dipstick NEGATIVE NEGATIVE   Bilirubin Urine NEGATIVE NEGATIVE   Ketones, ur NEGATIVE NEGATIVE mg/dL   Protein, ur NEGATIVE NEGATIVE mg/dL   Urobilinogen, UA 1.0 0.0 - 1.0 mg/dL   Nitrite NEGATIVE NEGATIVE   Leukocytes, UA NEGATIVE NEGATIVE  CBG monitoring, ED  Result Value Ref Range   Glucose-Capillary 95 65 - 99 mg/dL   Comment 1 Notify RN    Comment 2 Document in Chart    Laboratory interpretation all normal except leukocytosis     I, Devoria Albe, MD, personally reviewed and evaluated these images and lab results as part of my medical decision-making.  Imaging Review No results found.  EKG Interpretation None      MDM   Final diagnoses:  Scrotal abscess  Disposition pending CT  results  Devoria Albe, MD, FACEP   I personally performed the services described in this documentation, which was scribed in my presence. The recorded information has been reviewed and considered.  Devoria Albe, MD, Concha Pyo, MD 01/07/15 [phone]

## 2015-01-08 DIAGNOSIS — A419 Sepsis, unspecified organism: Principal | ICD-10-CM

## 2015-01-08 DIAGNOSIS — Z720 Tobacco use: Secondary | ICD-10-CM

## 2015-01-08 DIAGNOSIS — E1140 Type 2 diabetes mellitus with diabetic neuropathy, unspecified: Secondary | ICD-10-CM

## 2015-01-08 DIAGNOSIS — L0291 Cutaneous abscess, unspecified: Secondary | ICD-10-CM

## 2015-01-08 LAB — URINE CULTURE: Culture: NO GROWTH

## 2015-01-08 LAB — CBC
HEMATOCRIT: 41.7 % (ref 39.0–52.0)
HEMOGLOBIN: 13.9 g/dL (ref 13.0–17.0)
MCH: 27.5 pg (ref 26.0–34.0)
MCHC: 33.3 g/dL (ref 30.0–36.0)
MCV: 82.4 fL (ref 78.0–100.0)
Platelets: 189 10*3/uL (ref 150–400)
RBC: 5.06 MIL/uL (ref 4.22–5.81)
RDW: 13.3 % (ref 11.5–15.5)
WBC: 7.4 10*3/uL (ref 4.0–10.5)

## 2015-01-08 LAB — BASIC METABOLIC PANEL
Anion gap: 5 (ref 5–15)
BUN: 12 mg/dL (ref 6–20)
CHLORIDE: 108 mmol/L (ref 101–111)
CO2: 28 mmol/L (ref 22–32)
Calcium: 8.7 mg/dL — ABNORMAL LOW (ref 8.9–10.3)
Creatinine, Ser: 1.25 mg/dL — ABNORMAL HIGH (ref 0.61–1.24)
GFR calc non Af Amer: >60 mL/min (ref >60)
Glucose, Bld: 116 mg/dL — ABNORMAL HIGH (ref 65–99)
POTASSIUM: 4.7 mmol/L (ref 3.5–5.1)
SODIUM: 141 mmol/L (ref 135–145)

## 2015-01-08 LAB — GLUCOSE, CAPILLARY: Glucose-Capillary: 128 mg/dL — ABNORMAL HIGH (ref 65–99)

## 2015-01-08 LAB — HEMOGLOBIN A1C
Hgb A1c MFr Bld: 5.9 % — ABNORMAL HIGH (ref 4.8–5.6)
Mean Plasma Glucose: 123 mg/dL

## 2015-01-08 MED ORDER — SULFAMETHOXAZOLE-TRIMETHOPRIM 800-160 MG PO TABS
1.0000 | ORAL_TABLET | Freq: Two times a day (BID) | ORAL | Status: DC
  Administered 2015-01-08: 1 via ORAL
  Filled 2015-01-08 (×2): qty 1

## 2015-01-08 MED ORDER — SULFAMETHOXAZOLE-TRIMETHOPRIM 800-160 MG PO TABS: 1.0000 | ORAL_TABLET | Freq: Two times a day (BID) | ORAL | Status: AC

## 2015-01-08 MED ORDER — HYDROCODONE-ACETAMINOPHEN 5-325 MG PO TABS: 1.0000 | ORAL_TABLET | Freq: Four times a day (QID) | ORAL | Status: AC | PRN

## 2015-01-08 NOTE — Discharge Summary (Addendum)
Physician Discharge Summary  Brandon Owens KDT:[id number] DOB: 08/15/1955 DOA: 01/07/2015  PCP: Lance Bosch, NP  Admit date: 01/07/2015 Discharge date: 01/08/2015  Time spent: Less than 30 minutes  Recommendations for Outpatient Follow-up:  1. Chari Manning, NP/PCP in one week with repeat labs (CBC & BMP). Please follow final blood and urine culture results that were sent from the hospital. 2. Dr. Jackolyn Confer, General Surgery on 01/21/15 at 2:50 PM.  Discharge Diagnoses:  Principal Problem:   Groin abscess Active Problems:   Diabetes mellitus type 2 with neurological manifestations   Smoking   Abscess   Discharge Condition: Improved & Stable  Diet recommendation: Heart healthy and diabetic diet.  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 He reports previous abscess requiring I&D. In emergency department, vital signs notable for T 99.50F, HR up to 102, WBC 16 K. Patient underwent I&D by EDP and then a CT scan showed residual perineal abscess and surgeons were consulted.  Hospital Course:   Sepsis secondary to perineal abscess - Patient met sepsis criteria on admission. - He was treated with IV fluids and broad-spectrum IV antibiotics (IV vancomycin, Flagyl and aztreonam) - Surgeons evaluated him and performed I&D of perineal abscess which drained small amount of purulent fluid and left inguinal lump was aspirated and apparently was a lymph node. - Sepsis physiology has resolved. Lactate has normalized. Patient is afebrile. Leukocytosis resolved. - Surgeons have evaluated and cleared him for  discharge home. They have advised/educated him regarding wound care. Discussed with Dr. 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 - He will be transitioned to oral Bactrim to complete a total 7 days course of antibiotics. - Patient has been counseled extensively that he should not take opioids and drive, not to take opioids along with other sedative medications or with alcohol. He verbalizes understanding.  Dysuria/?? UTI/acute pyelonephritis - Patient has intermittent dysuria but urine microscopy was not impressive for UTI. CT abdomen however shows mild right perinephric fluid-uncertain significance. - Urine cultures negative to date. Blood cultures 2: Pending.  - Continue Bactrim. Reassess at follow-up during outpatient visit and consider if he needs a repeat CT or renal ultrasound to further evaluate.  Type II DM, controlled - Hemoglobin A1c: 5.9. Continue metformin.  Essential hypertension - Controlled. Not on medications. Consider restarting ACEI as outpatient for nephro protection from his diabetes.  Tobacco abuse - Has been smoking a pack of cigarettes per day for several years. Cessation counseled.  COPD - Stable  Prostatomegaly seen on CT abdomen - PSA 1.5/normal range.   Consultations:  General surgery   Procedures:  I&D of perineal abscess by general surgery on 8/25    Discharge Exam:  Complaints:  Patient feels much better with much improved pain at buttock and left groin site. States that he has to leave today so that he can prepare for son's wedding tomorrow.  Filed Vitals:   01/07/15 1400 01/07/15 2041 01/08/15 0512 01/08/15 0530  BP:  101/55  105/64  Pulse:  73  70  Temp:  98.3 F (36.8 C)  98.3 F (36.8 C)  TempSrc:  Oral  Oral  Resp:  24   20  Height: 5' 8"  (1.727 m)     Weight: 107.82 kg (237 lb 11.2 oz)  106.9 kg (235 lb 10.8 oz)   SpO2:  96%  96%    General exam: Pleasant middle-aged male sitting up comfortably in bed. Does not look septic or toxic.  Respiratory system: Clear. No increased work of breathing. Cardiovascular system: S1 & S2 heard, RRR. No JVD, murmurs, gallops, clicks or pedal edema. Gastrointestinal system: Abdomen is nondistended, soft and nontender. Normal bowel sounds heard. Central nervous system: Alert and oriented. No focal neurological deficits. Extremities: Symmetric 5 x 5 power.Left groin swelling-decompressed without acute findings.  Discharge Instructions      Discharge Instructions    Activity as tolerated - No restrictions    Complete by:  As directed      Call MD for:  difficulty breathing, headache or visual disturbances    Complete by:  As directed      Call MD for:  extreme fatigue    Complete by:  As directed      Call MD for:  hives    Complete by:  As directed      Call MD for:  persistant dizziness or light-headedness    Complete by:  As directed      Call MD for:  persistant nausea and vomiting    Complete by:  As directed      Call MD for:  redness, tenderness, or signs of infection (pain, swelling, redness, odor or green/yellow discharge around incision site)    Complete by:  As directed      Call MD for:  severe uncontrolled pain    Complete by:  As directed      Call MD for:  temperature >100.4    Complete by:  As directed      Diet - low sodium heart healthy    Complete by:  As directed      Diet Carb Modified    Complete by:  As directed      Discharge wound care:    Complete by:  As directed   As advised by surgeons.            Medication List    STOP taking these medications        albuterol 108 (90 BASE) MCG/ACT inhaler  Commonly known as:  PROVENTIL HFA;VENTOLIN HFA     gabapentin 300 MG capsule  Commonly known as:  NEURONTIN     lisinopril 5 MG  tablet  Commonly known as:  PRINIVIL,ZESTRIL      TAKE these medications        ANTI-FUNGAL 1 % cream  Generic drug:  clotrimazole  Apply 1 application topically 2 (two) times daily.     HYDROcodone-acetaminophen 5-325 MG per tablet  Commonly known as:  NORCO/VICODIN  Take 1-2 tablets by mouth every 6 (six) hours as needed for moderate pain or severe pain.     metFORMIN 500 MG tablet  Commonly known as:  GLUCOPHAGE  Take 1 tablet (500 mg total) by mouth 2 (two) times daily with a meal.     OMEGA-3 PO  Take 1 capsule by mouth 2 (two) times daily.  OVER THE COUNTER MEDICATION  Take 1 tablet by mouth daily.     OVER THE COUNTER MEDICATION  Take 1 tablet by mouth at bedtime as needed (sleep).     PROBIOTIC DAILY PO  Take 1 tablet by mouth.     sulfamethoxazole-trimethoprim 800-160 MG per tablet  Commonly known as:  BACTRIM DS,SEPTRA DS  Take 1 tablet by mouth 2 (two) times daily.       Follow-up Information    Follow up with ROSENBOWER,TODD J, MD. Schedule an appointment as soon as possible for a visit on 01/21/2015.   Specialty:  General Surgery   Why:  Please follow up with Dr. Zella Richer on Thursday, September 8th at 2:50pm   Contact information:   [place] [address] [phone]       Follow up with Lance Bosch, NP. Schedule an appointment as soon as possible for a visit in 1 week.   Specialty:  Internal Medicine   Why:  To be seen with repeat labs (CBC & BMP).   Contact information:   [place] [address] [phone]        The results of significant diagnostics from this hospitalization (including imaging, microbiology, ancillary and laboratory) are listed below for reference.    Significant Diagnostic Studies: Ct Abdomen Pelvis W Contrast  01/07/2015   CLINICAL DATA:  Scrotal abscess  EXAM: CT ABDOMEN AND PELVIS WITH CONTRAST  TECHNIQUE: Multidetector CT imaging of the abdomen and pelvis was performed  using the standard protocol following bolus administration of intravenous contrast.  CONTRAST:  100 cc Omnipaque 300  COMPARISON:  None.  FINDINGS: Mild diffuse hepatic steatosis  Gallbladder, spleen, pancreas, adrenal glands are within normal limits.  Small hypodensities in the kidneys are too small to characterize. There is a small amount of right perinephric fluid. No evidence of hydronephrosis.  Normal appendix.  The prostate is enlarged and lobulated extending through the base of the bladder. It is 5.9 x 5.6 x 5.5 cm. Several pelvic phleboliths are noted.  No free-fluid. No abnormal retroperitoneal adenopathy by measurement criteria.  Mild L5-S1 degenerative disc disease. No vertebral compression deformity.  IMPRESSION: Mild right perinephric fluid. Correlate with urinalysis as for the possibility of an inflammatory process.  Prostate is enlarged.  Correlate with PSA and physical exam.   Electronically Signed   By: Marybelle Killings M.D.   On: 01/07/2015 07:41    Microbiology: Recent Results (from the past 240 hour(s))  Culture, Urine     Status: None (Preliminary result)   Collection Time: 01/07/15  4:28 PM  Result Value Ref Range Status   Specimen Description URINE, CLEAN CATCH  Final   Special Requests NONE  Final   Culture   Final    NO GROWTH < 12 HOURS Performed at [facility]    Report Status PENDING  Incomplete     Labs: Basic Metabolic Panel:  Recent Labs Lab 01/07/15 0221 01/07/15 1245 01/08/15 0535  NA 137  --  141  K 4.0  --  4.7  CL 102  --  108  CO2 28  --  28  GLUCOSE 92  --  116*  BUN 13  --  12  CREATININE 1.34* 1.22 1.25*  CALCIUM 8.7*  --  8.7*   Liver Function Tests: No results for input(s): AST, ALT, ALKPHOS, BILITOT, PROT, ALBUMIN in the last 168 hours. No results for input(s): LIPASE, AMYLASE in the last 168 hours.  No results for input(s): AMMONIA in the last 168 hours. CBC:  Recent Labs Lab 01/07/15 0221 01/07/15 1245 01/08/15 0535  WBC  11.6* 16.1* 7.4  NEUTROABS 6.2  --   --   HGB 14.3 13.8 13.9  HCT 42.2 41.2 41.7  MCV 81.9 82.4 82.4  PLT 174 186 189   Cardiac Enzymes: No results for input(s): CKTOTAL, CKMB, CKMBINDEX, TROPONINI in the last 168 hours. BNP: BNP (last 3 results) No results for input(s): BNP in the last 8760 hours.  ProBNP (last 3 results) No results for input(s): PROBNP in the last 8760 hours.  CBG:  Recent Labs Lab 01/07/15 0245 01/07/15 1649 01/07/15 2353 01/08/15 0738  GLUCAP 95 93 111* 128*      Signed:  Vernell Leep, MD, FACP, FHM. [facility] Pager [phone]  If 7PM-7AM, please contact night-coverage [email or web address] Password [email or web address] 01/08/2015, 11:04 AM

## 2015-01-08 NOTE — Progress Notes (Signed)
Patient ID: Brandon Owens, male   DOB: [birth year], 59 y.o.   MRN: [id number]     [facility]      [address] [place], [place] [address]    Phone: [phone] FAX: [phone]     Subjective: Pain has greatly improved.  WBC normal.  Afebrile.    Objective:  Vital signs:  Filed Vitals:   01/07/15 1400 01/07/15 2041 01/08/15 0512 01/08/15 0530  BP:  101/55  105/64  Pulse:  73  70  Temp:  98.3 F (36.8 C)  98.3 F (36.8 C)  TempSrc:  Oral  Oral  Resp:  24  20  Height: 5' 8"  (1.727 m)     Weight: 107.82 kg (237 lb 11.2 oz)  106.9 kg (235 lb 10.8 oz)   SpO2:  96%  96%       Intake/Output   Yesterday:  08/25 0701 - 08/26 0700 In: 240 [P.O.:240] Out: -  This shift:     Physical Exam: General: Pt awake/alert/oriented x4 in no acute distress GU-wound is open, mild ttp, no surrounding areas of fluctuance.   Problem List:   Principal Problem:   Groin abscess Active Problems:   Diabetes mellitus type 2 with neurological manifestations   Smoking   Abscess    Results:   Labs: Results for orders placed or performed during the hospital encounter of 01/07/15 (from the past 48 hour(s))  CBC     Status: Abnormal   Collection Time: 01/07/15 12:45 PM  Result Value Ref Range   WBC 16.1 (H) 4.0 - 10.5 K/uL   RBC 5.00 4.22 - 5.81 MIL/uL   Hemoglobin 13.8 13.0 - 17.0 g/dL   HCT 41.2 39.0 - 52.0 %   MCV 82.4 78.0 - 100.0 fL   MCH 27.6 26.0 - 34.0 pg   MCHC 33.5 30.0 - 36.0 g/dL   RDW 13.2 11.5 - 15.5 %   Platelets 186 150 - 400 K/uL  Creatinine, serum     Status: None   Collection Time: 01/07/15 12:45 PM  Result Value Ref Range   Creatinine, Ser 1.22 0.61 - 1.24 mg/dL   GFR calc non Af Amer >60 >60 mL/min   GFR calc Af Amer >60 >60 mL/min    Comment: (NOTE) The eGFR has been calculated using the CKD EPI equation. This calculation has not been validated in all clinical situations. eGFR's persistently <60 mL/min  signify possible Chronic Kidney Disease.   Lactic acid, plasma     Status: Abnormal   Collection Time: 01/07/15 12:45 PM  Result Value Ref Range   Lactic Acid, Venous 2.1 (HH) 0.5 - 2.0 mmol/L    Comment: CRITICAL RESULT CALLED TO, READ BACK BY AND VERIFIED WITH: BLOCK D RN AT 1337 ON 8.25.16 BY MENDOZA B    Procalcitonin     Status: None   Collection Time: 01/07/15 12:45 PM  Result Value Ref Range   Procalcitonin <0.10 ng/mL    Comment:        Interpretation: PCT (Procalcitonin) <= 0.5 ng/mL: Systemic infection (sepsis) is not likely. Local bacterial infection is possible. (NOTE)         ICU PCT Algorithm               Non ICU PCT Algorithm    ----------------------------     ------------------------------         PCT < 0.25 ng/mL  PCT < 0.1 ng/mL     Stopping of antibiotics            Stopping of antibiotics       strongly encouraged.               strongly encouraged.    ----------------------------     ------------------------------       PCT level decrease by               PCT < 0.25 ng/mL       >= 80% from peak PCT       OR PCT 0.25 - 0.5 ng/mL          Stopping of antibiotics                                             encouraged.     Stopping of antibiotics           encouraged.    ----------------------------     ------------------------------       PCT level decrease by              PCT >= 0.25 ng/mL       < 80% from peak PCT        AND PCT >= 0.5 ng/mL            Continuin g antibiotics                                              encouraged.       Continuing antibiotics            encouraged.    ----------------------------     ------------------------------     PCT level increase compared          PCT > 0.5 ng/mL         with peak PCT AND          PCT >= 0.5 ng/mL             Escalation of antibiotics                                          strongly encouraged.      Escalation of antibiotics        strongly encouraged.   Protime-INR      Status: None   Collection Time: 01/07/15 12:45 PM  Result Value Ref Range   Prothrombin Time 15.0 11.6 - 15.2 seconds   INR 1.16 0.00 - 1.49  APTT     Status: None   Collection Time: 01/07/15 12:45 PM  Result Value Ref Range   aPTT 30 24 - 37 seconds  Lactic acid, plasma     Status: None   Collection Time: 01/07/15  3:46 PM  Result Value Ref Range   Lactic Acid, Venous 1.0 0.5 - 2.0 mmol/L  Hemoglobin A1c     Status: Abnormal   Collection Time: 01/07/15  3:46 PM  Result Value Ref Range   Hgb A1c MFr Bld 5.9 (H) 4.8 - 5.6 %    Comment: (NOTE)         Pre-diabetes:  5.7 - 6.4         Diabetes: >6.4         Glycemic control for adults with diabetes: <7.0    Mean Plasma Glucose 123 mg/dL    Comment: (NOTE) Performed At: [facility] [place], [place] [id number] Lindon Romp MD VO:[id number]   PSA     Status: None   Collection Time: 01/07/15  3:46 PM  Result Value Ref Range   PSA 1.50 0.00 - 4.00 ng/mL    Comment: (NOTE) While PSA levels of <=4.0 ng/ml are reported as reference range, some men with levels below 4.0 ng/ml can have prostate cancer and many men with PSA above 4.0 ng/ml do not have prostate cancer.  Other tests such as free PSA, age specific reference ranges, PSA velocity and PSA doubling time may be helpful especially in men less than 32 years old. Performed at [facility]   Culture, Urine     Status: None (Preliminary result)   Collection Time: 01/07/15  4:28 PM  Result Value Ref Range   Specimen Description URINE, CLEAN CATCH    Special Requests NONE    Culture      NO GROWTH < 12 HOURS Performed at [facility]    Report Status PENDING   Glucose, capillary     Status: None   Collection Time: 01/07/15  4:49 PM  Result Value Ref Range   Glucose-Capillary 93 65 - 99 mg/dL  Glucose, capillary     Status: Abnormal   Collection Time: 01/07/15 11:53 PM  Result Value Ref Range   Glucose-Capillary 111 (H) 65 - 99  mg/dL  Basic metabolic panel     Status: Abnormal   Collection Time: 01/08/15  5:35 AM  Result Value Ref Range   Sodium 141 135 - 145 mmol/L   Potassium 4.7 3.5 - 5.1 mmol/L   Chloride 108 101 - 111 mmol/L   CO2 28 22 - 32 mmol/L   Glucose, Bld 116 (H) 65 - 99 mg/dL   BUN 12 6 - 20 mg/dL   Creatinine, Ser 1.25 (H) 0.61 - 1.24 mg/dL   Calcium 8.7 (L) 8.9 - 10.3 mg/dL   GFR calc non Af Amer >60 >60 mL/min   GFR calc Af Amer >60 >60 mL/min    Comment: (NOTE) The eGFR has been calculated using the CKD EPI equation. This calculation has not been validated in all clinical situations. eGFR's persistently <60 mL/min signify possible Chronic Kidney Disease.    Anion gap 5 5 - 15  CBC     Status: None   Collection Time: 01/08/15  5:35 AM  Result Value Ref Range   WBC 7.4 4.0 - 10.5 K/uL   RBC 5.06 4.22 - 5.81 MIL/uL   Hemoglobin 13.9 13.0 - 17.0 g/dL   HCT 41.7 39.0 - 52.0 %   MCV 82.4 78.0 - 100.0 fL   MCH 27.5 26.0 - 34.0 pg   MCHC 33.3 30.0 - 36.0 g/dL   RDW 13.3 11.5 - 15.5 %   Platelets 189 150 - 400 K/uL  Glucose, capillary     Status: Abnormal   Collection Time: 01/08/15  7:38 AM  Result Value Ref Range   Glucose-Capillary 128 (H) 65 - 99 mg/dL    Imaging / Studies: Ct Abdomen Pelvis W Contrast  01/07/2015   CLINICAL DATA:  Scrotal abscess  EXAM: CT ABDOMEN AND PELVIS WITH CONTRAST  TECHNIQUE: Multidetector CT imaging of the abdomen and  pelvis was performed using the standard protocol following bolus administration of intravenous contrast.  CONTRAST:  100 cc Omnipaque 300  COMPARISON:  None.  FINDINGS: Mild diffuse hepatic steatosis  Gallbladder, spleen, pancreas, adrenal glands are within normal limits.  Small hypodensities in the kidneys are too small to characterize. There is a small amount of right perinephric fluid. No evidence of hydronephrosis.  Normal appendix.  The prostate is enlarged and lobulated extending through the base of the bladder. It is 5.9 x 5.6 x 5.5 cm.  Several pelvic phleboliths are noted.  No free-fluid. No abnormal retroperitoneal adenopathy by measurement criteria.  Mild L5-S1 degenerative disc disease. No vertebral compression deformity.  IMPRESSION: Mild right perinephric fluid. Correlate with urinalysis as for the possibility of an inflammatory process.  Prostate is enlarged.  Correlate with PSA and physical exam.   Electronically Signed   By: Marybelle Killings M.D.   On: 01/07/2015 07:41    Medications / Allergies:  Scheduled Meds: . aztreonam  2 g Intravenous 3 times per day  . enoxaparin (LOVENOX) injection  40 mg Subcutaneous Q24H  . insulin aspart  0-9 Units Subcutaneous TID WC  . metronidazole  500 mg Intravenous Q8H  . vancomycin  750 mg Intravenous Q12H   Continuous Infusions: . sodium chloride 75 mL/hr at 01/07/15 1334   PRN Meds:.HYDROcodone-acetaminophen, HYDROmorphone (DILAUDID) injection, ondansetron **OR** ondansetron (ZOFRAN) IV  Antibiotics: Anti-infectives    Start     Dose/Rate Route Frequency Ordered Stop   01/07/15 1400  metroNIDAZOLE (FLAGYL) IVPB 500 mg     500 mg 100 mL/hr over 60 Minutes Intravenous Every 8 hours 01/07/15 1234     01/07/15 1400  aztreonam (AZACTAM) 2 g in dextrose 5 % 50 mL IVPB     2 g 100 mL/hr over 30 Minutes Intravenous 3 times per day 01/07/15 1235     01/07/15 1300  vancomycin (VANCOCIN) 1,250 mg in sodium chloride 0.9 % 250 mL IVPB  Status:  Discontinued     1,250 mg 166.7 mL/hr over 90 Minutes Intravenous  Once 01/07/15 1128 01/07/15 1244   01/07/15 1300  vancomycin (VANCOCIN) IVPB 750 mg/150 ml premix     750 mg 150 mL/hr over 60 Minutes Intravenous Every 12 hours 01/07/15 1244     01/07/15 1130  aztreonam (AZACTAM) 2 g in dextrose 5 % 50 mL IVPB  Status:  Discontinued     2 g 100 mL/hr over 30 Minutes Intravenous  Once 01/07/15 1128 01/07/15 1235   01/07/15 1130  metroNIDAZOLE (FLAGYL) IVPB 500 mg  Status:  Discontinued     500 mg 100 mL/hr over 60 Minutes Intravenous  Once  01/07/15 1128 01/07/15 1234        Assessment/Plan Perineal abscess s/p bedside I&D-wound is open, adequately drained.  He is stable for discharge with PO antibiotics.  He already takes probiotics.  We reviewed wound care once again.  We discussed warning signs that warrant further evaluation.     Erby Pian, [facility] Pager [phone])   01/08/2015 8:29 AM

## 2015-01-08 NOTE — Discharge Instructions (Addendum)
Perineal wound care -soak in a tub for 10-15 minutes three times per day and after each bowel movement -the wound is going to continue to drain, change your dressing when it is saturated or soiled -complete all of your antibiotics!! -you need to schedule a follow up with Dr. Pamella Pert) in 2-3 weeks for a wound check. -call [phone] if you have fevers, chills, worsening pain. -STOP SMOKING; this increases the risk of you getting a recurrent infection and needed further surgeries. -Minimize carbohydrates.  Abscess An abscess is an infected area that contains a collection of pus and debris.It can occur in almost any part of the body. An abscess is also known as a furuncle or boil. CAUSES  An abscess occurs when tissue gets infected. This can occur from blockage of oil or sweat glands, infection of hair follicles, or a minor injury to the skin. As the body tries to fight the infection, pus collects in the area and creates pressure under the skin. This pressure causes pain. People with weakened immune systems have difficulty fighting infections and get certain abscesses more often.  SYMPTOMS Usually an abscess develops on the skin and becomes a painful mass that is red, warm, and tender. If the abscess forms under the skin, you may feel a moveable soft area under the skin. Some abscesses break open (rupture) on their own, but most will continue to get worse without care. The infection can spread deeper into the body and eventually into the bloodstream, causing you to feel ill.  DIAGNOSIS  Your caregiver will take your medical history and perform a physical exam. A sample of fluid may also be taken from the abscess to determine what is causing your infection. TREATMENT  Your caregiver may prescribe antibiotic medicines to fight the infection. However, taking antibiotics alone usually does not cure an abscess. Your caregiver may need to make a small cut (incision) in the abscess to drain the  pus. In some cases, gauze is packed into the abscess to reduce pain and to continue draining the area. HOME CARE INSTRUCTIONS   Only take over-the-counter or prescription medicines for pain, discomfort, or fever as directed by your caregiver.  If you were prescribed antibiotics, take them as directed. Finish them even if you start to feel better.  If gauze is used, follow your caregiver's directions for changing the gauze.  To avoid spreading the infection:  Keep your draining abscess covered with a bandage.  Wash your hands well.  Do not share personal care items, towels, or whirlpools with others.  Avoid skin contact with others.  Keep your skin and clothes clean around the abscess.  Keep all follow-up appointments as directed by your caregiver. SEEK MEDICAL CARE IF:   You have increased pain, swelling, redness, fluid drainage, or bleeding.  You have muscle aches, chills, or a general ill feeling.  You have a fever. MAKE SURE YOU:   Understand these instructions.  Will watch your condition.  Will get help right away if you are not doing well or get worse. Document Released: 02/08/2005 Document Revised: 10/31/2011 Document Reviewed: 07/14/2011 [facility] Patient Information 2015 [place], [place]. This information is not intended to replace advice given to you by your health care provider. Make sure you discuss any questions you have with your health care provider.

## 2015-01-12 LAB — CULTURE, BLOOD (ROUTINE X 2)
CULTURE: NO GROWTH
CULTURE: NO GROWTH

## 2015-01-12 NOTE — Progress Notes (Signed)
Addendum  Urine culture 1 and blood cultures 2: Negative  Phil Michels, MD, FACP, FHM. [facility] Pager [phone]  If 7PM-7AM, please contact night-coverage [email or web address] Password [email or web address] 01/12/2015, 5:03 PM

## 2015-01-15 ENCOUNTER — Inpatient Hospital Stay: Payer: Self-pay | Admitting: Internal Medicine

## 2015-01-20 ENCOUNTER — Telehealth: Payer: Self-pay | Admitting: Internal Medicine

## 2015-01-20 ENCOUNTER — Other Ambulatory Visit: Payer: Self-pay | Admitting: *Deleted

## 2015-01-20 DIAGNOSIS — E1149 Type 2 diabetes mellitus with other diabetic neurological complication: Secondary | ICD-10-CM

## 2015-01-20 DIAGNOSIS — E119 Type 2 diabetes mellitus without complications: Secondary | ICD-10-CM

## 2015-01-20 MED ORDER — METFORMIN HCL 500 MG PO TABS: 500.0000 mg | ORAL_TABLET | Freq: Two times a day (BID) | ORAL | Status: DC

## 2015-01-20 NOTE — Telephone Encounter (Signed)
Patient called stating he is out of medication and wants a refillmetFORMIN (GLUCOPHAGE) 500 MG tablet . Please f/u

## 2015-01-21 ENCOUNTER — Other Ambulatory Visit: Payer: Self-pay

## 2015-01-21 ENCOUNTER — Ambulatory Visit (HOSPITAL_COMMUNITY)
Admission: RE | Admit: 2015-01-21 | Discharge: 2015-01-21 | Disposition: A | Discharge status: Home / Self Care | Payer: BLUE CROSS/BLUE SHIELD | Source: Ambulatory Visit | Attending: Cardiology | Admitting: Cardiology

## 2015-01-21 ENCOUNTER — Ambulatory Visit (HOSPITAL_BASED_OUTPATIENT_CLINIC_OR_DEPARTMENT_OTHER): Payer: BLUE CROSS/BLUE SHIELD | Admitting: Internal Medicine

## 2015-01-21 ENCOUNTER — Encounter: Payer: Self-pay | Admitting: Internal Medicine

## 2015-01-21 VITALS — BP 115/77 | HR 100

## 2015-01-21 DIAGNOSIS — Z833 Family history of diabetes mellitus: Secondary | ICD-10-CM | POA: Diagnosis present

## 2015-01-21 DIAGNOSIS — K469 Unspecified abdominal hernia without obstruction or gangrene: Secondary | ICD-10-CM

## 2015-01-21 DIAGNOSIS — E119 Type 2 diabetes mellitus without complications: Secondary | ICD-10-CM | POA: Insufficient documentation

## 2015-01-21 DIAGNOSIS — Z888 Allergy status to other drugs, medicaments and biological substances status: Secondary | ICD-10-CM | POA: Insufficient documentation

## 2015-01-21 DIAGNOSIS — Z880 Allergy status to penicillin: Secondary | ICD-10-CM | POA: Diagnosis not present

## 2015-01-21 DIAGNOSIS — F17200 Nicotine dependence, unspecified, uncomplicated: Secondary | ICD-10-CM | POA: Insufficient documentation

## 2015-01-21 DIAGNOSIS — L84 Corns and callosities: Secondary | ICD-10-CM

## 2015-01-21 DIAGNOSIS — Z886 Allergy status to analgesic agent status: Secondary | ICD-10-CM | POA: Diagnosis not present

## 2015-01-21 DIAGNOSIS — Z79899 Other long term (current) drug therapy: Secondary | ICD-10-CM | POA: Insufficient documentation

## 2015-01-21 LAB — COMPLETE METABOLIC PANEL WITH GFR
ALT: 36 U/L (ref 9–46)
AST: 37 U/L — AB (ref 10–35)
Albumin: 4 g/dL (ref 3.6–5.1)
Alkaline Phosphatase: 50 U/L (ref 40–115)
BILIRUBIN TOTAL: 0.5 mg/dL (ref 0.2–1.2)
BUN: 11 mg/dL (ref 7–25)
CALCIUM: 9.2 mg/dL (ref 8.6–10.3)
CO2: 29 mmol/L (ref 20–31)
CREATININE: 1.11 mg/dL (ref 0.70–1.33)
Chloride: 102 mmol/L (ref 98–110)
GFR, EST AFRICAN AMERICAN: 84 mL/min (ref >=60)
GFR, Est Non African American: 72 mL/min (ref >=60)
Glucose, Bld: 83 mg/dL (ref 65–99)
Potassium: 5.1 mmol/L (ref 3.5–5.3)
Sodium: 138 mmol/L (ref 135–146)
TOTAL PROTEIN: 7.4 g/dL (ref 6.1–8.1)

## 2015-01-21 LAB — GLUCOSE, POCT (MANUAL RESULT ENTRY): POC GLUCOSE: 90 mg/dL (ref 70–99)

## 2015-01-21 NOTE — Patient Instructions (Signed)
Follow up the end of November for diabetes check

## 2015-01-21 NOTE — Progress Notes (Signed)
Patient ID: Brandon Owens, male   DOB: [birth year], 59 y.o.   MRN: [id number]  CC: HFU  HPI: Brandon Owens is a 59 y.o. male here today for a follow up visit.  Patient has past medical history of T2DM. Patient was in the hospital from 8/25-8/26 for a abscess removal on his scrotum. Patient reports that completed all antibiotics since discharge and feels much better with less pain. He has been keeping the area clean and ry. He has a follow up appointment with his surgeon today. He checks his sugars once per day with a average cbg reading of 90. His last A1C was 5.9%.   Patient reports that he has a hernia in his abdomen that is really bothering him. He would like a referral to his current surgeon so that he can repair his hernia soon as well.  Allergies  Allergen Reactions  . Aspirin Anaphylaxis  . Cephalexin Anaphylaxis  . Penicillins Anaphylaxis  . Flomax [Tamsulosin Hcl] Other (See Comments)    Causes blisters   Past Medical History  Diagnosis Date  . Hernia   . Diabetes mellitus without complication   . Asthma     as a child   Current Outpatient Prescriptions on File Prior to Visit  Medication Sig Dispense Refill  . metFORMIN (GLUCOPHAGE) 500 MG tablet Take 1 tablet (500 mg total) by mouth 2 (two) times daily with a meal. 60 tablet 0  . Omega-3 Fatty Acids (OMEGA-3 PO) Take 1 capsule by mouth 2 (two) times daily.     Marland Kitchen OVER THE COUNTER MEDICATION Take 1 tablet by mouth daily.    Marland Kitchen OVER THE COUNTER MEDICATION Take 1 tablet by mouth at bedtime as needed (sleep).    . Probiotic Product (PROBIOTIC DAILY PO) Take 1 tablet by mouth.    . clotrimazole (ANTI-FUNGAL) 1 % cream Apply 1 application topically 2 (two) times daily.    Marland Kitchen HYDROcodone-acetaminophen (NORCO/VICODIN) 5-325 MG per tablet Take 1-2 tablets by mouth every 6 (six) hours as needed for moderate pain or severe pain. 20 tablet 0  . sulfamethoxazole-trimethoprim (BACTRIM DS,SEPTRA DS) 800-160 MG per tablet Take 1 tablet by  mouth 2 (two) times daily. (Patient not taking: Reported on 01/21/2015) 13 tablet 0  . [DISCONTINUED] pantoprazole (PROTONIX) 40 MG tablet Take 1 tablet (40 mg total) by mouth daily. 30 tablet 0   No current facility-administered medications on file prior to visit.   Family History  Problem Relation Age of Onset  . Diabetes Other   . Heart failure Father    Social History   Social History  . Marital Status: Married    Spouse Name: N/A  . Number of Children: N/A  . Years of Education: N/A   Occupational History  . Not on file.   Social History Main Topics  . Smoking status: Current Every Day Smoker -- 1.00 packs/day  . Smokeless tobacco: Never Used  . Alcohol Use: No  . Drug Use: No  . Sexual Activity: Not on file   Other Topics Concern  . Not on file   Social History Narrative    Review of Systems: Other than what is stated in HPI, all other systems are negative.   Objective:   Filed Vitals:   01/21/15 1218  BP: 115/77  Pulse: 100    Physical Exam  Constitutional: He is oriented to person, place, and time.  Cardiovascular: Normal rate, regular rhythm and normal heart sounds.   Pulmonary/Chest: Effort normal and breath sounds normal.  Abdominal: Soft. Bowel sounds are normal. There is no tenderness. A hernia is present.  Feet:  Right Foot:  Skin Integrity: Positive for callus.  Left Foot:  Skin Integrity: Positive for callus.  Neurological: He is alert and oriented to person, place, and time.  Skin: Skin is warm and dry. No erythema.  Psychiatric: He has a normal mood and affect.     Lab Results  Component Value Date   WBC 7.4 01/08/2015   HGB 13.9 01/08/2015   HCT 41.7 01/08/2015   MCV 82.4 01/08/2015   PLT 189 01/08/2015   Lab Results  Component Value Date   CREATININE 1.25* 01/08/2015   BUN 12 01/08/2015   NA 141 01/08/2015   K 4.7 01/08/2015   CL 108 01/08/2015   CO2 28 01/08/2015    Lab Results  Component Value Date   HGBA1C 5.9*  01/07/2015   Lipid Panel     Component Value Date/Time   CHOL 147 08/19/2013 1748   TRIG 153* 08/19/2013 1748   HDL 45 08/19/2013 1748   CHOLHDL 3.3 08/19/2013 1748   VLDL 31 08/19/2013 1748   LDLCALC 71 08/19/2013 1748       Assessment and plan:   Keldon was seen today for follow-up.  Diagnoses and all orders for this visit:  Abdominal hernia without obstruction and without gangrene, recurrence not specified, unspecified hernia type -     Ambulatory referral to General Surgery Will send referral to Dr. Avel Peace, General Surgery  Type 2 diabetes mellitus without complication -     Glucose (CBG) -     CBC with Differential -     COMPLETE METABOLIC PANEL WITH GFR Patients diabetes is well control as evidence by consistently low a1c.  Patient will continue with current therapy and continue to make necessary lifestyle changes.  Reviewed foot care, diet, exercise, annual health maintenance with patient.   Callus -     Ambulatory referral to Podiatry   Follow up in 3 months for DM and as needed       Ambrose Finland, NP-C [facility] [phone] 01/21/2015, 12:50 PM

## 2015-01-21 NOTE — Progress Notes (Signed)
Patient here for follow up from the hospital Patient had surgery for a scrotal abscess Patient also complains of having a flutter feeling in his chest the past  Couple of months

## 2015-01-22 LAB — PATHOLOGIST SMEAR REVIEW

## 2015-01-22 LAB — CBC WITH DIFFERENTIAL/PLATELET
BASOS ABS: 0 10*3/uL (ref 0.0–0.1)
Basophils Relative: 0 % (ref 0–1)
Eosinophils Absolute: 0.1 10*3/uL (ref 0.0–0.7)
Eosinophils Relative: 1 % (ref 0–5)
HEMATOCRIT: 44.9 % (ref 39.0–52.0)
Hemoglobin: 15.1 g/dL (ref 13.0–17.0)
LYMPHS ABS: 6.3 10*3/uL — AB (ref 0.7–4.0)
LYMPHS PCT: 64 % — AB (ref 12–46)
MCH: 27.7 pg (ref 26.0–34.0)
MCHC: 33.6 g/dL (ref 30.0–36.0)
MCV: 82.4 fL (ref 78.0–100.0)
MPV: 9.1 fL (ref 8.6–12.4)
Monocytes Absolute: 0.7 10*3/uL (ref 0.1–1.0)
Monocytes Relative: 7 % (ref 3–12)
NEUTROS PCT: 28 % — AB (ref 43–77)
Neutro Abs: 2.8 10*3/uL (ref 1.7–7.7)
PLATELETS: 328 10*3/uL (ref 150–400)
RBC: 5.45 MIL/uL (ref 4.22–5.81)
RDW: 13.6 % (ref 11.5–15.5)
WBC: 9.9 10*3/uL (ref 4.0–10.5)

## 2015-01-26 ENCOUNTER — Inpatient Hospital Stay: Payer: Self-pay | Admitting: Internal Medicine

## 2015-01-27 ENCOUNTER — Telehealth: Payer: Self-pay

## 2015-01-27 NOTE — Telephone Encounter (Signed)
Spoke with patient and he is aware of his lab results 

## 2015-01-27 NOTE — Telephone Encounter (Signed)
-----   Message from Ambrose Finland, NP sent at 01/27/2015  9:25 AM EDT ----- Labs are within normal limits

## 2015-02-01 ENCOUNTER — Encounter: Payer: Self-pay | Admitting: Internal Medicine

## 2015-02-15 ENCOUNTER — Other Ambulatory Visit: Payer: Self-pay | Admitting: Internal Medicine

## 2015-07-19 ENCOUNTER — Other Ambulatory Visit: Payer: Self-pay | Admitting: Internal Medicine

## 2015-07-20 ENCOUNTER — Other Ambulatory Visit: Payer: Self-pay | Admitting: Internal Medicine

## 2015-07-21 ENCOUNTER — Telehealth: Payer: Self-pay | Admitting: Internal Medicine

## 2015-07-21 MED ORDER — METFORMIN HCL 500 MG PO TABS: ORAL_TABLET | ORAL | Status: AC

## 2015-07-21 NOTE — Telephone Encounter (Signed)
Pt. Called requesting a med refill on Metformin. Pt. Stated he is out of the medication for a couple of days. Pt. Would want it to be sent to [facility] on [address]. Please f/u

## 2015-08-10 ENCOUNTER — Telehealth: Payer: Self-pay | Admitting: Internal Medicine

## 2015-08-10 NOTE — Telephone Encounter (Signed)
Patient states that metformin causes his heart to flutter and is needing advice on what to do. Ok to leave voicemail. Please follow up.

## 2015-08-11 ENCOUNTER — Telehealth: Payer: Self-pay

## 2015-08-11 NOTE — Telephone Encounter (Signed)
Returned patient phone call Patient not available Message left on voice mail to return our call 

## 2015-08-11 NOTE — Telephone Encounter (Signed)
Patient called back stating when he takes his metformin his Heart flutters Has been off for three days and the fluttering has stopped Does he need to come to be seen

## 2015-08-12 ENCOUNTER — Telehealth: Payer: Self-pay

## 2015-08-12 NOTE — Telephone Encounter (Signed)
How long has he been on Metformin? This is not a new drug. If he feels like he is having heart palpitations he should be seen for a EKG

## 2015-08-12 NOTE — Telephone Encounter (Signed)
Tried to contact patient Patient not available Message left on voice mail to return our call 

## 2016-06-08 ENCOUNTER — Ambulatory Visit: Payer: BLUE CROSS/BLUE SHIELD | Attending: Physician Assistant | Admitting: Physician Assistant

## 2016-06-08 VITALS — BP 125/76 | HR 116 | Temp 100.0°F | Resp 24

## 2016-06-08 DIAGNOSIS — R05 Cough: Secondary | ICD-10-CM | POA: Diagnosis not present

## 2016-06-08 DIAGNOSIS — R059 Cough, unspecified: Secondary | ICD-10-CM

## 2016-06-08 DIAGNOSIS — J069 Acute upper respiratory infection, unspecified: Secondary | ICD-10-CM | POA: Diagnosis not present

## 2016-06-08 DIAGNOSIS — E119 Type 2 diabetes mellitus without complications: Secondary | ICD-10-CM

## 2016-06-08 DIAGNOSIS — J209 Acute bronchitis, unspecified: Secondary | ICD-10-CM

## 2016-06-08 MED ORDER — PHENYLEPHRINE HCL 1 % NA SOLN: 1.0000 [drp] | Freq: Four times a day (QID) | NASAL | 0 refills | Status: AC | PRN

## 2016-06-08 MED ORDER — DM-GUAIFENESIN ER 30-600 MG PO TB12
1.0000 | ORAL_TABLET | Freq: Two times a day (BID) | ORAL | 0 refills | Status: AC
Start: 2016-06-08 — End: 2016-06-13

## 2016-06-08 MED ORDER — OSELTAMIVIR PHOSPHATE 75 MG PO CAPS: 75.0000 mg | ORAL_CAPSULE | Freq: Two times a day (BID) | ORAL | 0 refills | Status: AC

## 2016-06-08 MED ORDER — SALINE SPRAY 0.65 % NA SOLN: 1.0000 | NASAL | 0 refills | Status: AC | PRN

## 2016-06-08 NOTE — Progress Notes (Signed)
Weak, malaise Sx's for 2 days/ Chest and nasal congestion  Unable to get anything up when cough and feels like he may pass out.

## 2016-06-08 NOTE — Patient Instructions (Addendum)
Please return in one week to manage your diabetes.   Upper Respiratory Infection, Adult Most upper respiratory infections (URIs) are caused by a virus. A URI affects the nose, throat, and upper air passages. The most common type of URI is often called "the common cold." Follow these instructions at home:  Take medicines only as told by your doctor.  Gargle warm saltwater or take cough drops to comfort your throat as told by your doctor.  Use a warm mist humidifier or inhale steam from a shower to increase air moisture. This may make it easier to breathe.  Drink enough fluid to keep your pee (urine) clear or pale yellow.  Eat soups and other clear broths.  Have a healthy diet.  Rest as needed.  Go back to work when your fever is gone or your doctor says it is okay.  You may need to stay home longer to avoid giving your URI to others.  You can also wear a face mask and wash your hands often to prevent spread of the virus.  Use your inhaler more if you have asthma.  Do not use any tobacco products, including cigarettes, chewing tobacco, or electronic cigarettes. If you need help quitting, ask your doctor. Contact a doctor if:  You are getting worse, not better.  Your symptoms are not helped by medicine.  You have chills.  You are getting more short of breath.  You have brown or red mucus.  You have yellow or brown discharge from your nose.  You have pain in your face, especially when you bend forward.  You have a fever.  You have puffy (swollen) neck glands.  You have pain while swallowing.  You have white areas in the back of your throat. Get help right away if:  You have very bad or constant:  Headache.  Ear pain.  Pain in your forehead, behind your eyes, and over your cheekbones (sinus pain).  Chest pain.  You have long-lasting (chronic) lung disease and any of the following:  Wheezing.  Long-lasting cough.  Coughing up blood.  A change in your  usual mucus.  You have a stiff neck.  You have changes in your:  Vision.  Hearing.  Thinking.  Mood. This information is not intended to replace advice given to you by your health care provider. Make sure you discuss any questions you have with your health care provider. Document Released: 10/18/2007 Document Revised: 01/02/2016 Document Reviewed: 08/06/2013 Elsevier Interactive Patient Education  2017 [employer]

## 2016-06-09 ENCOUNTER — Encounter: Payer: Self-pay | Admitting: Physician Assistant

## 2016-06-09 LAB — HEMOGLOBIN A1C
HEMOGLOBIN A1C: 5.8 % — AB (ref <5.7)
Mean Plasma Glucose: 120 mg/dL

## 2016-06-09 LAB — INFLUENZA A AND B AG, IMMUNOASSAY
Influenza A Antigen: NOT DETECTED
Influenza B Antigen: NOT DETECTED

## 2016-06-09 NOTE — Progress Notes (Signed)
Patient ID: Brandon Owens, male   DOB: [birth year], 61 y.o.   MRN: [id number]    Subjective:  Patient ID: Brandon Owens, male    DOB: September 08, 1955  Age: 61 y.o. MRN: [id number]  CC: Cold or flu  [name] presents for 2 day history of cough, malaise, fatigue, chills, and nasal congestion. 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 Has taken OTC remedy without relief. Denies fever, nausea, vomiting, chest pain, SOB, headache, abdominal pain, rash, GI/GU sxs.  Outpatient Medications Prior to Visit  Medication Sig Dispense Refill  . Omega-3 Fatty Acids (OMEGA-3 PO) Take 1 capsule by mouth 2 (two) times daily.     Marland Kitchen OVER THE COUNTER MEDICATION Take 1 tablet by mouth daily.    . Probiotic Product (PROBIOTIC DAILY PO) Take 1 tablet by mouth.    . clotrimazole (ANTI-FUNGAL) 1 % cream Apply 1 application topically 2 (two) times daily.    Marland Kitchen HYDROcodone-acetaminophen (NORCO/VICODIN) 5-325 MG per tablet Take 1-2 tablets by mouth every 6 (six) hours as needed for moderate pain or severe pain. (Patient not taking: Reported on 06/08/2016) 20 tablet 0  . metFORMIN (GLUCOPHAGE) 500 MG tablet TAKE 1 TABLET (500 MG TOTAL) BY MOUTH 2 (TWO) TIMES DAILY WITH A MEAL. (Patient not taking: Reported on 06/08/2016) 60 tablet 0  . OVER THE COUNTER MEDICATION Take 1 tablet by mouth at bedtime as needed (sleep).    Marland Kitchen sulfamethoxazole-trimethoprim (BACTRIM DS,SEPTRA DS) 800-160 MG per tablet Take 1 tablet by mouth 2 (two) times daily. (Patient not taking: Reported on 01/21/2015) 13 tablet 0   No facility-administered medications prior to visit.     ROS Review of Systems  Constitutional: Positive for chills, fatigue and fever.  HENT: Positive for congestion and rhinorrhea. Negative for sore throat and trouble swallowing.   Eyes: Negative for pain and discharge.  Respiratory: Positive for cough. Negative for chest tightness, shortness of breath and wheezing.   Cardiovascular: Negative for chest pain  and leg swelling.  Gastrointestinal: Negative for abdominal pain.  Genitourinary: Negative for dysuria.  Musculoskeletal: Negative for neck pain and neck stiffness.  Skin: Negative for rash.  Neurological: Negative for dizziness, light-headedness and headaches.  Psychiatric/Behavioral: Negative for agitation.    Objective:  BP 125/76 (BP Location: Right Arm, Patient Position: Sitting, Cuff Size: Large)   Pulse (!) 116   Temp 100 F (37.8 C) (Oral)   Resp (!) 24   SpO2 94%   BP/Weight 06/08/2016 01/21/2015 01/08/2015  Systolic BP 125 115 105  Diastolic BP 76 77 64  Wt. (Lbs) - - 235.67  BMI - - -      Physical Exam  Constitutional: He is oriented to person, place, and time.  NAD, obese, polite  HENT:  Head: Normocephalic and atraumatic.  Eyes: Conjunctivae are normal. Pupils are equal, round, and reactive to light. No scleral icterus.  Neck: Normal range of motion. Neck supple.  Cardiovascular: Normal rate, regular rhythm and normal heart sounds.   Pulmonary/Chest: Effort normal and breath sounds normal. No respiratory distress. He has no wheezes. He has no rales.  Abdominal: Soft. Bowel sounds are normal.  Musculoskeletal: He exhibits no edema.  Lymphadenopathy:    He has cervical adenopathy.  Neurological: He is alert and oriented to person, place, and time.  Skin: Skin is warm and dry. He is not diaphoretic.  Psychiatric: He has a normal mood and affect. His behavior is normal. Thought content normal.     Assessment &  Plan:   1. Acute upper respiratory infection  - oseltamivir (TAMIFLU) 75 MG capsule; Take 1 capsule (75 mg total) by mouth 2 (two) times daily.  Dispense: 10 capsule; Refill: 0 - dextromethorphan-guaiFENesin (MUCINEX DM) 30-600 MG 12hr tablet; Take 1 tablet by mouth 2 (two) times daily.  Dispense: 10 tablet; Refill: 0 - phenylephrine (NEO-SYNEPHRINE) 1 % nasal spray; Place 1 drop into both nostrils every 6 (six) hours as needed for congestion.  Dispense:  30 mL; Refill: 0 - sodium chloride (OCEAN) 0.65 % SOLN nasal spray; Place 1 spray into both nostrils as needed for congestion.  Dispense: 1 Bottle; Refill: 0 - Influenza A and B Ag, Immunoassay  2. Acute bronchitis, unspecified organism  - oseltamivir (TAMIFLU) 75 MG capsule; Take 1 capsule (75 mg total) by mouth 2 (two) times daily.  Dispense: 10 capsule; Refill: 0 - dextromethorphan-guaiFENesin (MUCINEX DM) 30-600 MG 12hr tablet; Take 1 tablet by mouth 2 (two) times daily.  Dispense: 10 tablet; Refill: 0 - phenylephrine (NEO-SYNEPHRINE) 1 % nasal spray; Place 1 drop into both nostrils every 6 (six) hours as needed for congestion.  Dispense: 30 mL; Refill: 0 - sodium chloride (OCEAN) 0.65 % SOLN nasal spray; Place 1 spray into both nostrils as needed for congestion.  Dispense: 1 Bottle; Refill: 0 - Influenza A and B Ag, Immunoassay  3. Cough  - oseltamivir (TAMIFLU) 75 MG capsule; Take 1 capsule (75 mg total) by mouth 2 (two) times daily.  Dispense: 10 capsule; Refill: 0 - dextromethorphan-guaiFENesin (MUCINEX DM) 30-600 MG 12hr tablet; Take 1 tablet by mouth 2 (two) times daily.  Dispense: 10 tablet; Refill: 0 - phenylephrine (NEO-SYNEPHRINE) 1 % nasal spray; Place 1 drop into both nostrils every 6 (six) hours as needed for congestion.  Dispense: 30 mL; Refill: 0 - sodium chloride (OCEAN) 0.65 % SOLN nasal spray; Place 1 spray into both nostrils as needed for congestion.  Dispense: 1 Bottle; Refill: 0 - Influenza A and B Ag, Immunoassay  4. Type 2 diabetes mellitus without complication, without long-term current use of insulin (HCC)  - Hemoglobin A1c   Meds ordered this encounter  Medications  . oseltamivir (TAMIFLU) 75 MG capsule    Sig: Take 1 capsule (75 mg total) by mouth 2 (two) times daily.    Dispense:  10 capsule    Refill:  0    Order Specific Question:   Supervising Provider    Answer:   Quentin AngstJEGEDE, OLUGBEMIGA E [id number]  . dextromethorphan-guaiFENesin (MUCINEX DM) 30-600 MG  12hr tablet    Sig: Take 1 tablet by mouth 2 (two) times daily.    Dispense:  10 tablet    Refill:  0    Order Specific Question:   Supervising Provider    Answer:   Quentin AngstJEGEDE, OLUGBEMIGA E [id number]  . phenylephrine (NEO-SYNEPHRINE) 1 % nasal spray    Sig: Place 1 drop into both nostrils every 6 (six) hours as needed for congestion.    Dispense:  30 mL    Refill:  0    Order Specific Question:   Supervising Provider    Answer:   Quentin AngstJEGEDE, OLUGBEMIGA E [id number]  . sodium chloride (OCEAN) 0.65 % SOLN nasal spray    Sig: Place 1 spray into both nostrils as needed for congestion.    Dispense:  1 Bottle    Refill:  0    Order Specific Question:   Supervising Provider    Answer:   Quentin AngstJEGEDE, OLUGBEMIGA E [id number]    Follow-up: Return in  about 1 week (around 06/15/2016) for DM2 f/u.   Loletta Specter PA

## 2016-11-08 IMAGING — CT CT ABD-PELV W/ CM
2 of 5 series · 16 of 46 positions shown, 18 images · IV contrast (100 ML OMNI 300)
Comparison: None.

CLINICAL DATA: Scrotal abscess

EXAM:
CT ABDOMEN AND PELVIS WITH CONTRAST
TECHNIQUE: Multidetector CT imaging of the abdomen and pelvis was performed
using the standard protocol following bolus administration of
intravenous contrast.
CONTRAST:  100 cc Omnipaque 300

[Series 2: abd/pel with · axial · 0.74mm/px · z∈[+920,+1390]mm · 13 of 104 slices shown, 15 images]
[im 5/104  soft-tissue]
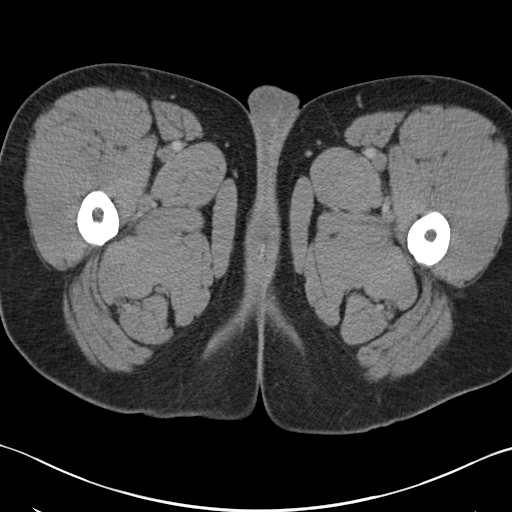
[im 5/104  bone]
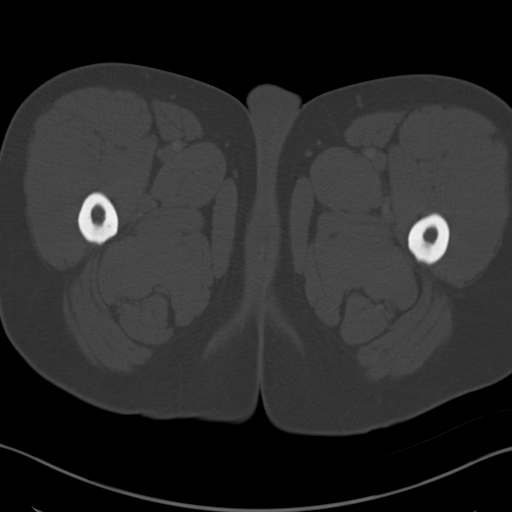
[im 15/104  soft-tissue]
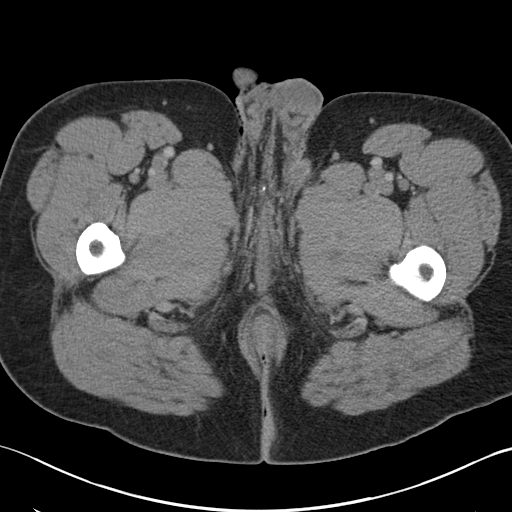
[im 20/104  soft-tissue]
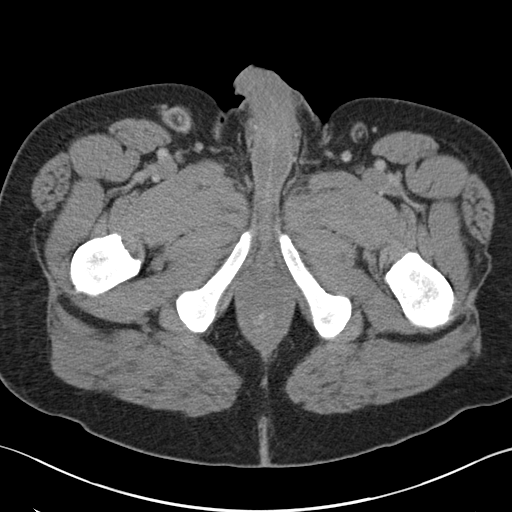
[im 30/104  soft-tissue]
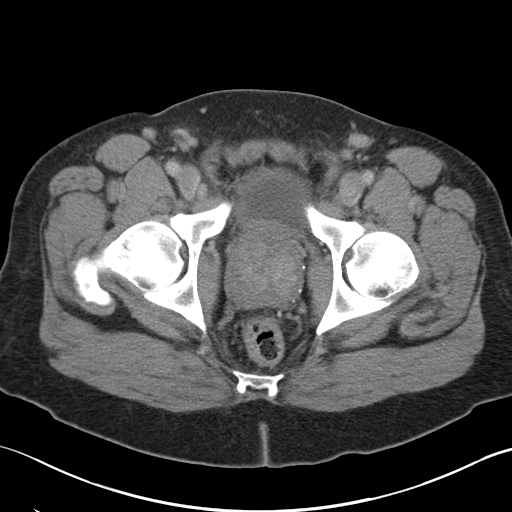
[im 35/104  soft-tissue]
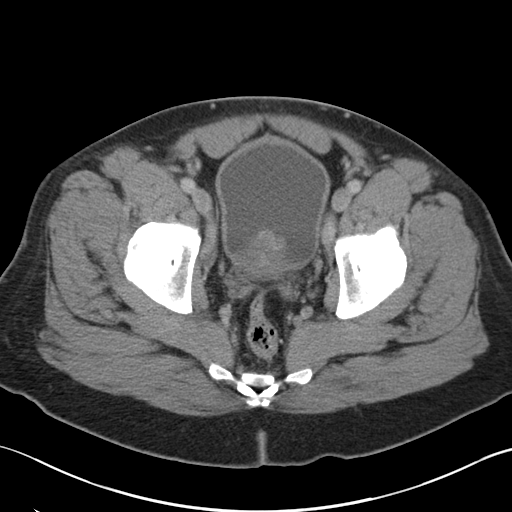
[im 45/104  soft-tissue]
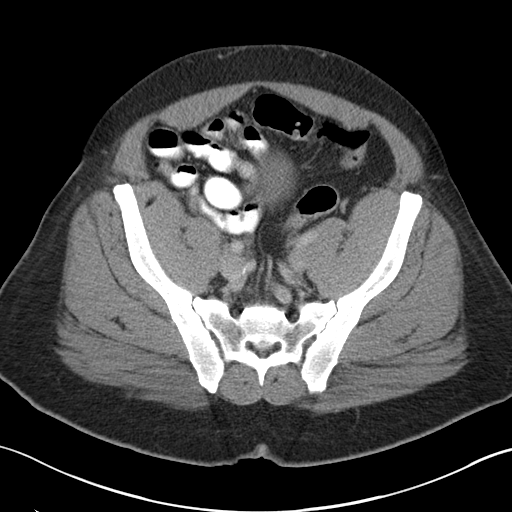
[im 54/104  soft-tissue]
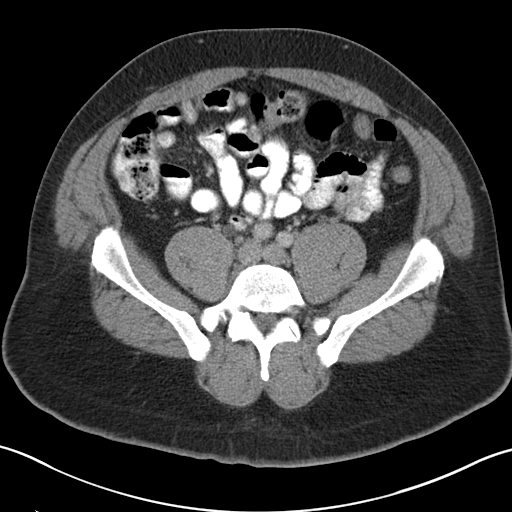
[im 59/104  soft-tissue]
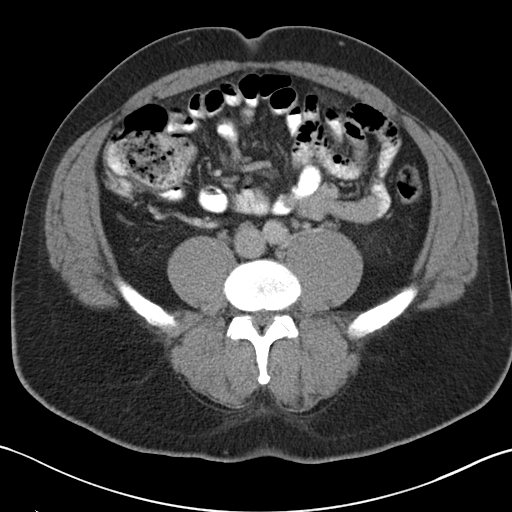
[im 69/104  soft-tissue]
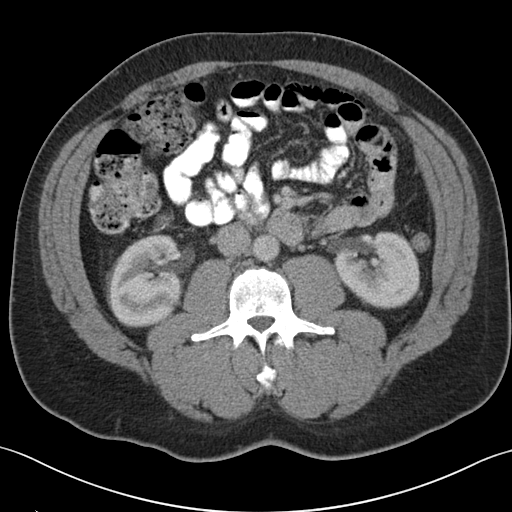
[im 69/104  bone]
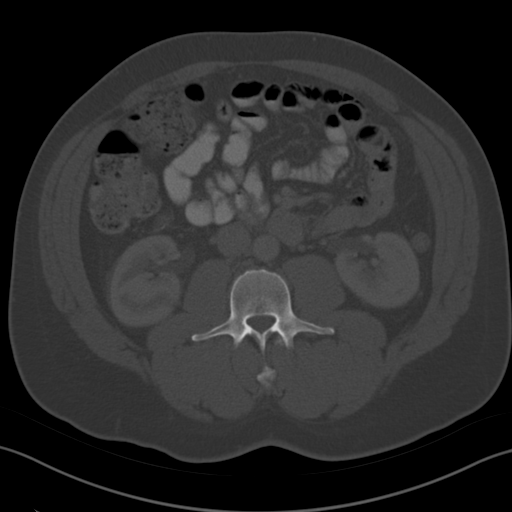
[im 74/104  soft-tissue]
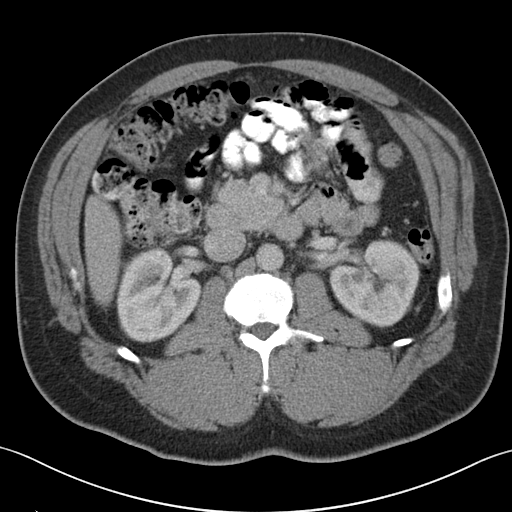
[im 84/104  soft-tissue]
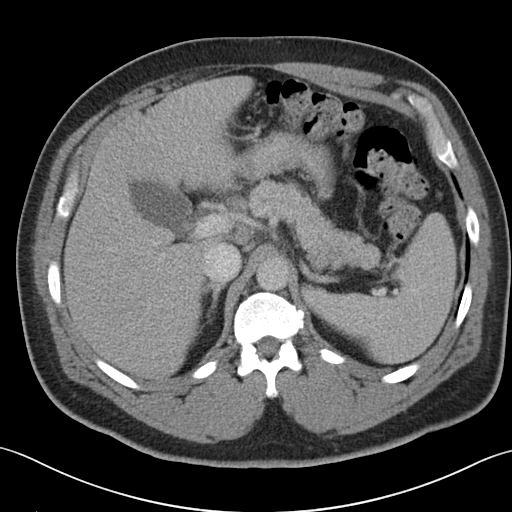
[im 89/104  soft-tissue]
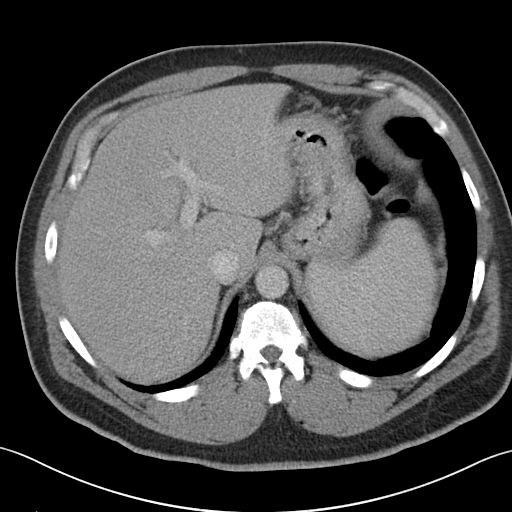
[im 99/104  soft-tissue]
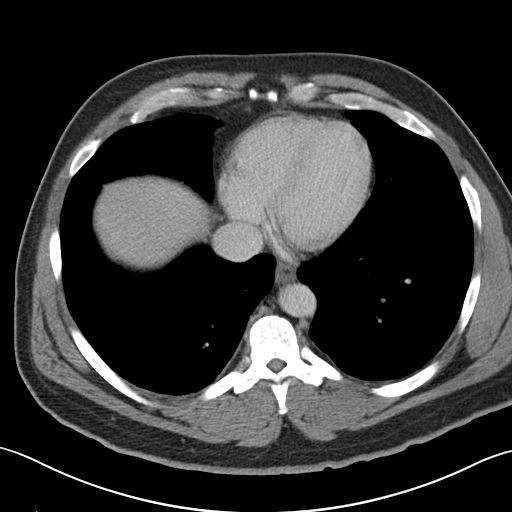

[Series 4: coronal a/|p · coronal · 0.70mm/px · 3 of 101 slices shown]
[im 34/101  soft-tissue]
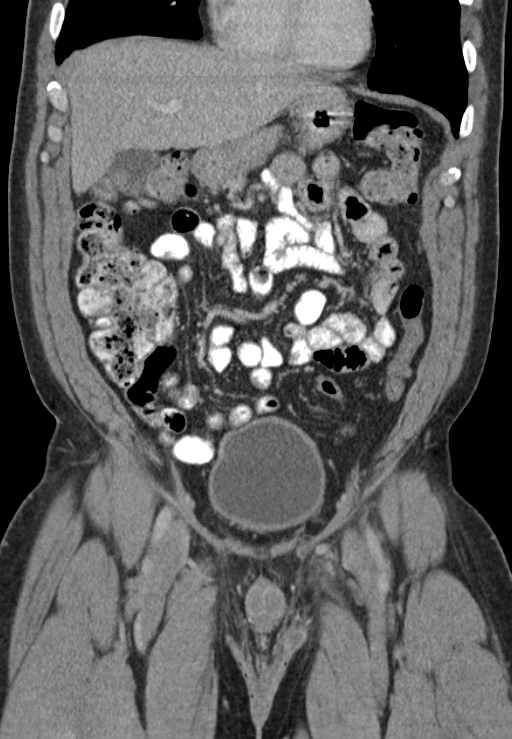
[im 45/101  soft-tissue]
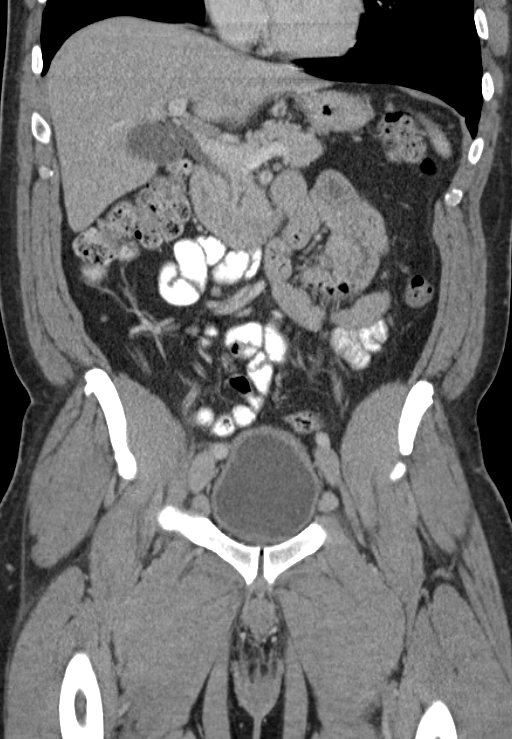
[im 56/101  soft-tissue]
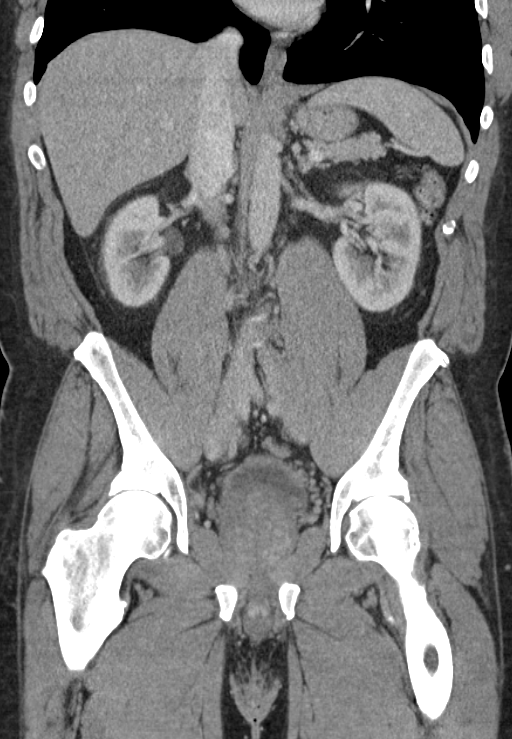

[16 of 46 positions shown; findings below may reference images not displayed]

FINDINGS: Mild diffuse hepatic steatosis

Gallbladder, spleen, pancreas, adrenal glands are within normal
limits.

Small hypodensities in the kidneys are too small to characterize.
There is a small amount of right perinephric fluid. No evidence of
hydronephrosis.

Normal appendix.

The prostate is enlarged and lobulated extending through the base of
the bladder. It is 5.9 x 5.6 x 5.5 cm. Several pelvic phleboliths
are noted.

No free-fluid. No abnormal retroperitoneal adenopathy by measurement
criteria.

Mild L5-S1 degenerative disc disease. No vertebral compression
deformity.
IMPRESSION: Mild right perinephric fluid. Correlate with urinalysis as for the
possibility of an inflammatory process.

Prostate is enlarged.  Correlate with PSA and physical exam.
